# Patient Record
Sex: Female | Born: 1965 | Race: White | Hispanic: No | Smoking: Current every day smoker
Health system: Southern US, Community
[De-identification: ages and names within clinical notes are randomized; demographics above are authoritative.]

## PROBLEM LIST (undated history)

## (undated) DIAGNOSIS — F329 Major depressive disorder, single episode, unspecified: Secondary | ICD-10-CM

## (undated) DIAGNOSIS — D751 Secondary polycythemia: Secondary | ICD-10-CM

## (undated) DIAGNOSIS — F32A Depression, unspecified: Secondary | ICD-10-CM

## (undated) DIAGNOSIS — F419 Anxiety disorder, unspecified: Secondary | ICD-10-CM

## (undated) DIAGNOSIS — M1990 Unspecified osteoarthritis, unspecified site: Secondary | ICD-10-CM

## (undated) DIAGNOSIS — M5030 Other cervical disc degeneration, unspecified cervical region: Secondary | ICD-10-CM

## (undated) DIAGNOSIS — T7840XA Allergy, unspecified, initial encounter: Secondary | ICD-10-CM

## (undated) HISTORY — DX: Secondary polycythemia: D75.1

## (undated) HISTORY — PX: APPENDECTOMY: SHX54

## (undated) HISTORY — PX: ABLATION: SHX5711

## (undated) HISTORY — DX: Depression, unspecified: F32.A

## (undated) HISTORY — DX: Unspecified osteoarthritis, unspecified site: M19.90

## (undated) HISTORY — DX: Other cervical disc degeneration, unspecified cervical region: M50.30

## (undated) HISTORY — DX: Allergy, unspecified, initial encounter: T78.40XA

## (undated) HISTORY — PX: ABDOMINAL EXPLORATION SURGERY: SHX538

## (undated) HISTORY — DX: Anxiety disorder, unspecified: F41.9

## (undated) HISTORY — DX: Major depressive disorder, single episode, unspecified: F32.9

## (undated) HISTORY — PX: TUBAL LIGATION: SHX77

---

## 2003-06-06 ENCOUNTER — Emergency Department (HOSPITAL_COMMUNITY): Admission: EM | Admit: 2003-06-06 | Discharge: 2003-06-06 | Payer: Self-pay | Admitting: Emergency Medicine

## 2003-06-06 ENCOUNTER — Encounter: Payer: Self-pay | Admitting: Emergency Medicine

## 2004-02-03 ENCOUNTER — Other Ambulatory Visit: Admission: RE | Admit: 2004-02-03 | Discharge: 2004-02-03 | Payer: Self-pay | Admitting: Family Medicine

## 2005-10-25 ENCOUNTER — Other Ambulatory Visit: Admission: RE | Admit: 2005-10-25 | Discharge: 2005-10-25 | Payer: Self-pay | Admitting: Family Medicine

## 2006-12-21 ENCOUNTER — Other Ambulatory Visit: Admission: RE | Admit: 2006-12-21 | Discharge: 2006-12-21 | Payer: Self-pay | Admitting: Family Medicine

## 2008-01-03 ENCOUNTER — Other Ambulatory Visit: Admission: RE | Admit: 2008-01-03 | Discharge: 2008-01-03 | Payer: Self-pay | Admitting: Family Medicine

## 2008-04-07 ENCOUNTER — Encounter: Admission: RE | Admit: 2008-04-07 | Discharge: 2008-04-07 | Payer: Self-pay | Admitting: Family Medicine

## 2008-07-03 ENCOUNTER — Encounter: Admission: RE | Admit: 2008-07-03 | Discharge: 2008-07-22 | Payer: Self-pay | Admitting: Family Medicine

## 2008-09-01 ENCOUNTER — Encounter: Admission: RE | Admit: 2008-09-01 | Discharge: 2008-10-07 | Payer: Self-pay | Admitting: Family Medicine

## 2009-02-03 ENCOUNTER — Other Ambulatory Visit: Admission: RE | Admit: 2009-02-03 | Discharge: 2009-02-03 | Payer: Self-pay | Admitting: Family Medicine

## 2009-11-22 ENCOUNTER — Emergency Department: Payer: Self-pay | Admitting: Emergency Medicine

## 2010-02-10 ENCOUNTER — Other Ambulatory Visit: Admission: RE | Admit: 2010-02-10 | Discharge: 2010-02-10 | Payer: Self-pay | Admitting: Family Medicine

## 2010-03-04 ENCOUNTER — Encounter: Admission: RE | Admit: 2010-03-04 | Discharge: 2010-03-04 | Payer: Self-pay | Admitting: Family Medicine

## 2010-12-12 ENCOUNTER — Ambulatory Visit: Payer: BC Managed Care – PPO | Attending: Specialist

## 2010-12-12 DIAGNOSIS — M25619 Stiffness of unspecified shoulder, not elsewhere classified: Secondary | ICD-10-CM | POA: Insufficient documentation

## 2010-12-12 DIAGNOSIS — IMO0001 Reserved for inherently not codable concepts without codable children: Secondary | ICD-10-CM | POA: Insufficient documentation

## 2010-12-12 DIAGNOSIS — M25519 Pain in unspecified shoulder: Secondary | ICD-10-CM | POA: Insufficient documentation

## 2010-12-12 DIAGNOSIS — R5381 Other malaise: Secondary | ICD-10-CM | POA: Insufficient documentation

## 2010-12-14 ENCOUNTER — Ambulatory Visit: Payer: BC Managed Care – PPO | Admitting: Physical Therapy

## 2010-12-19 ENCOUNTER — Ambulatory Visit: Payer: BC Managed Care – PPO | Admitting: Physical Therapy

## 2010-12-22 ENCOUNTER — Ambulatory Visit: Payer: BC Managed Care – PPO

## 2010-12-26 ENCOUNTER — Ambulatory Visit: Payer: BC Managed Care – PPO

## 2010-12-29 ENCOUNTER — Ambulatory Visit: Payer: BC Managed Care – PPO

## 2011-01-05 ENCOUNTER — Ambulatory Visit: Payer: BC Managed Care – PPO

## 2011-01-10 ENCOUNTER — Ambulatory Visit: Payer: BC Managed Care – PPO | Attending: Specialist

## 2011-01-10 DIAGNOSIS — R5381 Other malaise: Secondary | ICD-10-CM | POA: Insufficient documentation

## 2011-01-10 DIAGNOSIS — IMO0001 Reserved for inherently not codable concepts without codable children: Secondary | ICD-10-CM | POA: Insufficient documentation

## 2011-01-10 DIAGNOSIS — M25519 Pain in unspecified shoulder: Secondary | ICD-10-CM | POA: Insufficient documentation

## 2011-01-10 DIAGNOSIS — M25619 Stiffness of unspecified shoulder, not elsewhere classified: Secondary | ICD-10-CM | POA: Insufficient documentation

## 2011-03-17 ENCOUNTER — Other Ambulatory Visit: Payer: Self-pay | Admitting: Family Medicine

## 2011-03-17 ENCOUNTER — Ambulatory Visit (HOSPITAL_COMMUNITY)
Admission: RE | Admit: 2011-03-17 | Discharge: 2011-03-17 | Disposition: A | Discharge status: Home / Self Care | Payer: BC Managed Care – PPO | Source: Ambulatory Visit | Attending: Family Medicine | Admitting: Family Medicine

## 2011-03-17 DIAGNOSIS — M79606 Pain in leg, unspecified: Secondary | ICD-10-CM

## 2011-03-17 DIAGNOSIS — R609 Edema, unspecified: Secondary | ICD-10-CM

## 2011-03-17 DIAGNOSIS — M79609 Pain in unspecified limb: Secondary | ICD-10-CM | POA: Insufficient documentation

## 2011-03-17 DIAGNOSIS — M7989 Other specified soft tissue disorders: Secondary | ICD-10-CM | POA: Insufficient documentation

## 2011-04-04 ENCOUNTER — Other Ambulatory Visit: Payer: Self-pay | Admitting: Family Medicine

## 2011-04-04 ENCOUNTER — Other Ambulatory Visit (HOSPITAL_COMMUNITY)
Admission: RE | Admit: 2011-04-04 | Discharge: 2011-04-04 | Disposition: A | Discharge status: Home / Self Care | Payer: BC Managed Care – PPO | Source: Ambulatory Visit | Attending: Family Medicine | Admitting: Family Medicine

## 2011-04-04 DIAGNOSIS — Z124 Encounter for screening for malignant neoplasm of cervix: Secondary | ICD-10-CM | POA: Insufficient documentation

## 2011-04-04 DIAGNOSIS — Z1159 Encounter for screening for other viral diseases: Secondary | ICD-10-CM | POA: Insufficient documentation

## 2011-05-16 ENCOUNTER — Other Ambulatory Visit: Payer: Self-pay | Admitting: Family Medicine

## 2011-05-16 DIAGNOSIS — N644 Mastodynia: Secondary | ICD-10-CM

## 2011-05-22 ENCOUNTER — Ambulatory Visit
Admission: RE | Admit: 2011-05-22 | Discharge: 2011-05-22 | Disposition: A | Discharge status: Home / Self Care | Payer: BC Managed Care – PPO | Source: Ambulatory Visit | Attending: Family Medicine | Admitting: Family Medicine

## 2011-05-22 DIAGNOSIS — N644 Mastodynia: Secondary | ICD-10-CM

## 2011-11-07 ENCOUNTER — Other Ambulatory Visit: Payer: Self-pay | Admitting: Otolaryngology

## 2011-11-07 DIAGNOSIS — H905 Unspecified sensorineural hearing loss: Secondary | ICD-10-CM

## 2011-11-10 ENCOUNTER — Ambulatory Visit
Admission: RE | Admit: 2011-11-10 | Discharge: 2011-11-10 | Disposition: A | Discharge status: Home / Self Care | Payer: BC Managed Care – PPO | Source: Ambulatory Visit | Attending: Otolaryngology | Admitting: Otolaryngology

## 2011-11-10 DIAGNOSIS — H905 Unspecified sensorineural hearing loss: Secondary | ICD-10-CM

## 2011-11-10 MED ORDER — GADOBENATE DIMEGLUMINE 529 MG/ML IV SOLN
15.0000 mL | Freq: Once | INTRAVENOUS | Status: AC | PRN
  Administered 2011-11-10: 15 mL via INTRAVENOUS

## 2012-05-16 ENCOUNTER — Other Ambulatory Visit: Payer: Self-pay | Admitting: Family Medicine

## 2012-05-16 DIAGNOSIS — Z1231 Encounter for screening mammogram for malignant neoplasm of breast: Secondary | ICD-10-CM

## 2012-06-04 ENCOUNTER — Ambulatory Visit
Admission: RE | Admit: 2012-06-04 | Discharge: 2012-06-04 | Disposition: A | Discharge status: Home / Self Care | Payer: BC Managed Care – PPO | Source: Ambulatory Visit | Attending: Family Medicine | Admitting: Family Medicine

## 2012-06-04 DIAGNOSIS — Z1231 Encounter for screening mammogram for malignant neoplasm of breast: Secondary | ICD-10-CM

## 2012-06-07 ENCOUNTER — Other Ambulatory Visit: Payer: Self-pay | Admitting: Family Medicine

## 2012-06-07 DIAGNOSIS — R928 Other abnormal and inconclusive findings on diagnostic imaging of breast: Secondary | ICD-10-CM

## 2012-06-12 ENCOUNTER — Ambulatory Visit
Admission: RE | Admit: 2012-06-12 | Discharge: 2012-06-12 | Disposition: A | Discharge status: Home / Self Care | Payer: BC Managed Care – PPO | Source: Ambulatory Visit | Attending: Family Medicine | Admitting: Family Medicine

## 2012-06-12 DIAGNOSIS — R928 Other abnormal and inconclusive findings on diagnostic imaging of breast: Secondary | ICD-10-CM

## 2013-05-19 ENCOUNTER — Ambulatory Visit
Admission: RE | Admit: 2013-05-19 | Discharge: 2013-05-19 | Disposition: A | Discharge status: Home / Self Care | Payer: BC Managed Care – PPO | Source: Ambulatory Visit | Attending: Family Medicine | Admitting: Family Medicine

## 2013-05-19 ENCOUNTER — Other Ambulatory Visit: Payer: Self-pay | Admitting: Lactation Services

## 2013-05-19 DIAGNOSIS — R609 Edema, unspecified: Secondary | ICD-10-CM

## 2013-05-20 ENCOUNTER — Other Ambulatory Visit: Payer: Self-pay | Admitting: Family Medicine

## 2013-05-20 DIAGNOSIS — N921 Excessive and frequent menstruation with irregular cycle: Secondary | ICD-10-CM

## 2013-05-21 ENCOUNTER — Other Ambulatory Visit: Payer: Self-pay

## 2013-05-21 DIAGNOSIS — Z1231 Encounter for screening mammogram for malignant neoplasm of breast: Secondary | ICD-10-CM

## 2013-05-22 ENCOUNTER — Ambulatory Visit
Admission: RE | Admit: 2013-05-22 | Discharge: 2013-05-22 | Disposition: A | Discharge status: Home / Self Care | Payer: BC Managed Care – PPO | Source: Ambulatory Visit | Attending: Family Medicine | Admitting: Family Medicine

## 2013-05-22 DIAGNOSIS — N921 Excessive and frequent menstruation with irregular cycle: Secondary | ICD-10-CM

## 2013-06-05 ENCOUNTER — Ambulatory Visit
Admission: RE | Admit: 2013-06-05 | Discharge: 2013-06-05 | Disposition: A | Discharge status: Home / Self Care | Payer: BC Managed Care – PPO | Source: Ambulatory Visit

## 2013-06-05 DIAGNOSIS — Z1231 Encounter for screening mammogram for malignant neoplasm of breast: Secondary | ICD-10-CM

## 2013-12-17 ENCOUNTER — Other Ambulatory Visit: Payer: Self-pay | Admitting: Obstetrics & Gynecology

## 2013-12-17 ENCOUNTER — Other Ambulatory Visit (HOSPITAL_COMMUNITY)
Admission: RE | Admit: 2013-12-17 | Discharge: 2013-12-17 | Disposition: A | Discharge status: Home / Self Care | Payer: BC Managed Care – PPO | Source: Ambulatory Visit | Attending: Obstetrics & Gynecology | Admitting: Obstetrics & Gynecology

## 2013-12-17 DIAGNOSIS — Z01419 Encounter for gynecological examination (general) (routine) without abnormal findings: Secondary | ICD-10-CM | POA: Insufficient documentation

## 2014-05-01 ENCOUNTER — Other Ambulatory Visit: Payer: Self-pay

## 2014-05-01 DIAGNOSIS — Z1231 Encounter for screening mammogram for malignant neoplasm of breast: Secondary | ICD-10-CM

## 2014-06-05 ENCOUNTER — Ambulatory Visit
Admission: RE | Admit: 2014-06-05 | Discharge: 2014-06-05 | Disposition: A | Discharge status: Home / Self Care | Payer: BC Managed Care – PPO | Source: Ambulatory Visit | Attending: Family Medicine | Admitting: Family Medicine

## 2014-06-05 ENCOUNTER — Other Ambulatory Visit: Payer: Self-pay | Admitting: Family Medicine

## 2014-06-05 DIAGNOSIS — D751 Secondary polycythemia: Secondary | ICD-10-CM

## 2014-06-08 ENCOUNTER — Ambulatory Visit: Payer: BC Managed Care – PPO

## 2014-06-19 ENCOUNTER — Ambulatory Visit: Payer: BC Managed Care – PPO

## 2014-07-09 ENCOUNTER — Telehealth: Payer: Self-pay | Admitting: Hematology and Oncology

## 2014-07-09 NOTE — Telephone Encounter (Signed)
LEFT MESSAGE FOR PATIENT AND GAVE NP APPT FOR 10/6 @ 2 W/DR. [place]. CONTACT INFORMATION WAS LEFT FOR PATIENT TO RETURN CALL TO CONFIRM MESSAGE/NP APPT.

## 2014-07-14 ENCOUNTER — Ambulatory Visit (HOSPITAL_BASED_OUTPATIENT_CLINIC_OR_DEPARTMENT_OTHER): Payer: BC Managed Care – PPO

## 2014-07-14 ENCOUNTER — Encounter (INDEPENDENT_AMBULATORY_CARE_PROVIDER_SITE_OTHER): Payer: Self-pay

## 2014-07-14 ENCOUNTER — Telehealth: Payer: Self-pay | Admitting: Hematology and Oncology

## 2014-07-14 ENCOUNTER — Ambulatory Visit: Payer: BC Managed Care – PPO

## 2014-07-14 ENCOUNTER — Ambulatory Visit (HOSPITAL_BASED_OUTPATIENT_CLINIC_OR_DEPARTMENT_OTHER): Payer: BC Managed Care – PPO | Admitting: Hematology and Oncology

## 2014-07-14 ENCOUNTER — Other Ambulatory Visit: Payer: Self-pay | Admitting: *Deleted

## 2014-07-14 ENCOUNTER — Encounter: Payer: Self-pay | Admitting: Hematology and Oncology

## 2014-07-14 VITALS — BP 116/63 | HR 73 | Temp 98.5°F | Resp 18 | Ht 65.5 in | Wt 180.4 lb

## 2014-07-14 DIAGNOSIS — D751 Secondary polycythemia: Secondary | ICD-10-CM

## 2014-07-14 DIAGNOSIS — Z720 Tobacco use: Secondary | ICD-10-CM

## 2014-07-14 DIAGNOSIS — Z716 Tobacco abuse counseling: Secondary | ICD-10-CM | POA: Insufficient documentation

## 2014-07-14 HISTORY — DX: Secondary polycythemia: D75.1

## 2014-07-14 LAB — CBC WITH DIFFERENTIAL/PLATELET
BASO%: 0.1 % (ref 0.0–2.0)
BASOS ABS: 0 10*3/uL (ref 0.0–0.1)
EOS ABS: 0.1 10*3/uL (ref 0.0–0.5)
EOS%: 0.9 % (ref 0.0–7.0)
HEMATOCRIT: 48.3 % — AB (ref 34.8–46.6)
HEMOGLOBIN: 17.1 g/dL — AB (ref 11.6–15.9)
LYMPH#: 2.5 10*3/uL (ref 0.9–3.3)
LYMPH%: 31.8 % (ref 14.0–49.7)
MCH: 32.5 pg (ref 25.1–34.0)
MCHC: 35.4 g/dL (ref 31.5–36.0)
MCV: 91.8 fL (ref 79.5–101.0)
MONO#: 0.6 10*3/uL (ref 0.1–0.9)
MONO%: 7.7 % (ref 0.0–14.0)
NEUT%: 59.5 % (ref 38.4–76.8)
NEUTROS ABS: 4.6 10*3/uL (ref 1.5–6.5)
Platelets: 175 10*3/uL (ref 145–400)
RBC: 5.26 10*6/uL (ref 3.70–5.45)
RDW: 13.3 % (ref 11.2–14.5)
WBC: 7.7 10*3/uL (ref 3.9–10.3)

## 2014-07-14 NOTE — Progress Notes (Signed)
[facility] NOTE  Patient Care Team: Carola Mitzi Davenport, MD as PCP - General (Family Medicine)  CHIEF COMPLAINTS/PURPOSE OF CONSULTATION:  Erythrocytosis  HISTORY OF PRESENTING ILLNESS:  Erica Bailey 48 y.o. female is here because of elevated hemoglobin.  She was found to have abnormal CBC from routine blood work monitoring. The CBC from February 2015 to present had fluctuated from 15.8-16.7. Serum erythropoietin was within normal limits She denies intermittent headaches, shortness of breath on exertion, or occasional chest pain. She complained of leg cramps She never suffer from diagnosis of blood clot.  There is no prior diagnosis of obstructive sleep apnea. The patient denies weight loss or skin itching. The patient is a smoker and currently smokes 1/2 pack of cigarettes per day for the last 20+ years.  MEDICAL HISTORY:  Past Medical History  Diagnosis Date  . Depression   . Arthritis   . Polycythemia   . Polycythemia secondary to smoking 07/14/2014    SURGICAL HISTORY: Past Surgical History  Procedure Laterality Date  . Abdominal exploration surgery    . Tubal ligation    . Ablation      SOCIAL HISTORY: History   Social History  . Marital Status: Unknown    Spouse Name: N/A    Number of Children: N/A  . Years of Education: N/A   Occupational History  . Not on file.   Social History Main Topics  . Smoking status: Current Every Day Smoker -- 1.00 packs/day for 28 years  . Smokeless tobacco: Never Used  . Alcohol Use: No  . Drug Use: No  . Sexual Activity: Not on file   Other Topics Concern  . Not on file   Social History Narrative  . No narrative on file    FAMILY HISTORY: Family History  Problem Relation Age of Onset  . Cancer Mother     lung ca  . Clotting disorder Father     embolism    ALLERGIES:  is allergic to chantix; flexeril; and prednisone.  MEDICATIONS:  Current Outpatient Prescriptions  Medication Sig  Dispense Refill  . ALPRAZolam (XANAX) 1 MG tablet Take 1 mg by mouth 2 (two) times daily as needed.      Marland Kitchen escitalopram (LEXAPRO) 20 MG tablet Take 20 mg by mouth daily.      Marland Kitchen ibuprofen (ADVIL,MOTRIN) 200 MG tablet Take 200 mg by mouth every 6 (six) hours as needed.      . metaxalone (SKELAXIN) 800 MG tablet Take by mouth as needed.       No current facility-administered medications for this visit.    REVIEW OF SYSTEMS:   Constitutional: Denies fevers, chills or abnormal night sweats Eyes: Denies blurriness of vision, double vision or watery eyes Ears, nose, mouth, throat, and face: Denies mucositis or sore throat Respiratory: Denies cough, dyspnea or wheezes Cardiovascular: Denies palpitation, chest discomfort or lower extremity swelling Gastrointestinal:  Denies nausea, heartburn or change in bowel habits Skin: Denies abnormal skin rashes Lymphatics: Denies new lymphadenopathy or easy bruising Neurological:Denies numbness, tingling or new weaknesses Behavioral/Psych: Mood is stable, no new changes  All other systems were reviewed with the patient and are negative.  PHYSICAL EXAMINATION: ECOG PERFORMANCE STATUS: 0 - Asymptomatic  Filed Vitals:   07/14/14 1408  BP: 116/63  Pulse: 73  Temp: 98.5 F (36.9 C)  Resp: 18   Filed Weights   07/14/14 1408  Weight: 180 lb 6.4 oz (81.829 kg)    GENERAL:alert, no distress and comfortable.  She is moderately obese SKIN: skin color, texture, turgor are normal, no rashes or significant lesions EYES: normal, conjunctiva are pink and non-injected, sclera clear OROPHARYNX:no exudate, no erythema and lips, buccal mucosa, and tongue normal  NECK: supple, thyroid normal size, non-tender, without nodularity LYMPH:  no palpable lymphadenopathy in the cervical, axillary or inguinal LUNGS: clear to auscultation and percussion with normal breathing effort HEART: regular rate & rhythm and no murmurs and no lower extremity edema ABDOMEN:abdomen  soft, non-tender and normal bowel sounds Musculoskeletal:no cyanosis of digits and no clubbing  PSYCH: alert & oriented x 3 with fluent speech NEURO: no focal motor/sensory deficits  LABORATORY DATA:  I have reviewed the data as listed Recent Results (from the past 2160 hour(s))  CBC WITH DIFFERENTIAL     Status: Abnormal   Collection Time    07/14/14  3:04 PM      Result Value Ref Range   WBC 7.7  3.9 - 10.3 10e3/uL   NEUT# 4.6  1.5 - 6.5 10e3/uL   HGB 17.1 (*) 11.6 - 15.9 g/dL   HCT 48.3 (*) 34.8 - 46.6 %   Platelets 175  145 - 400 10e3/uL   MCV 91.8  79.5 - 101.0 fL   MCH 32.5  25.1 - 34.0 pg   MCHC 35.4  31.5 - 36.0 g/dL   RBC 5.26  3.70 - 5.45 10e6/uL   RDW 13.3  11.2 - 14.5 %   lymph# 2.5  0.9 - 3.3 10e3/uL   MONO# 0.6  0.1 - 0.9 10e3/uL   Eosinophils Absolute 0.1  0.0 - 0.5 10e3/uL   Basophils Absolute 0.0  0.0 - 0.1 10e3/uL   NEUT% 59.5  38.4 - 76.8 %   LYMPH% 31.8  14.0 - 49.7 %   MONO% 7.7  0.0 - 14.0 %   EOS% 0.9  0.0 - 7.0 %   BASO% 0.1  0.0 - 2.0 %   ASSESSMENT & PLAN Polycythemia secondary to smoking Polycythemia is likely due to smoking. She is motivated to quit. I told her to start aspirin to reduce the risk of thrombosis. In addition, I also recommend phlebotomy to keep hematocrit less than 48. I will get her blood rechecked again in 2 weeks. If her hematocrit is within acceptable limits, and I plan to see her back next year to recheck her blood work. If she still has persistent polycythemia, I will refer her to a pulmonologist for workup of polycythemia for possible undiagnosed obstructive sleep apnea  Tobacco abuse counseling I spent some time counseling the patient the importance of tobacco cessation. she is currently attempting to quit on her own  I gave her patient education handout and encouraged her to sign up for smoking cessation class.

## 2014-07-14 NOTE — Patient Instructions (Signed)

## 2014-07-14 NOTE — Assessment & Plan Note (Signed)
Polycythemia is likely due to smoking. She is motivated to quit. I told her to start aspirin to reduce the risk of thrombosis. In addition, I also recommend phlebotomy to keep hematocrit less than 48. I will get her blood rechecked again in 2 weeks. If her hematocrit is within acceptable limits, and I plan to see her back next year to recheck her blood work. If she still has persistent polycythemia, I will refer her to a pulmonologist for workup of polycythemia for possible undiagnosed obstructive sleep apnea

## 2014-07-14 NOTE — Progress Notes (Signed)
Right AC accessed with 16g needle for purpose of phlebotomy. Removed 510gms. Tolerated well.  Refreshment/snacks provided.   30 minute observation period. VSS

## 2014-07-14 NOTE — Telephone Encounter (Signed)
, °

## 2014-07-14 NOTE — Progress Notes (Signed)
Checked in new pt with no financial concerns. °

## 2014-07-14 NOTE — Assessment & Plan Note (Signed)
I spent some time counseling the patient the importance of tobacco cessation. she is currently attempting to quit on her own  I gave her patient education handout and encouraged her to sign up for smoking cessation class.  

## 2014-07-15 ENCOUNTER — Telehealth: Payer: Self-pay | Admitting: Hematology and Oncology

## 2014-07-15 NOTE — Telephone Encounter (Signed)
, °

## 2014-07-22 ENCOUNTER — Ambulatory Visit: Payer: BC Managed Care – PPO

## 2014-07-28 ENCOUNTER — Other Ambulatory Visit: Payer: BC Managed Care – PPO

## 2014-07-28 ENCOUNTER — Telehealth: Payer: Self-pay | Admitting: Hematology and Oncology

## 2014-07-28 NOTE — Telephone Encounter (Signed)
pt called to r/s lab...done pt pt request....pt aware of new d.t

## 2014-07-30 ENCOUNTER — Other Ambulatory Visit (HOSPITAL_BASED_OUTPATIENT_CLINIC_OR_DEPARTMENT_OTHER): Payer: BC Managed Care – PPO

## 2014-07-30 DIAGNOSIS — D751 Secondary polycythemia: Secondary | ICD-10-CM

## 2014-07-30 LAB — CBC WITH DIFFERENTIAL/PLATELET
BASO%: 0.3 % (ref 0.0–2.0)
BASOS ABS: 0 10*3/uL (ref 0.0–0.1)
EOS%: 1.1 % (ref 0.0–7.0)
Eosinophils Absolute: 0.1 10*3/uL (ref 0.0–0.5)
HEMATOCRIT: 43.5 % (ref 34.8–46.6)
HEMOGLOBIN: 14.7 g/dL (ref 11.6–15.9)
LYMPH%: 32.7 % (ref 14.0–49.7)
MCH: 31.3 pg (ref 25.1–34.0)
MCHC: 33.8 g/dL (ref 31.5–36.0)
MCV: 92.8 fL (ref 79.5–101.0)
MONO#: 0.6 10*3/uL (ref 0.1–0.9)
MONO%: 10 % (ref 0.0–14.0)
NEUT#: 3.4 10*3/uL (ref 1.5–6.5)
NEUT%: 55.9 % (ref 38.4–76.8)
PLATELETS: 249 10*3/uL (ref 145–400)
RBC: 4.69 10*6/uL (ref 3.70–5.45)
RDW: 13.5 % (ref 11.2–14.5)
WBC: 6.1 10*3/uL (ref 3.9–10.3)
lymph#: 2 10*3/uL (ref 0.9–3.3)

## 2014-08-05 ENCOUNTER — Ambulatory Visit: Payer: BC Managed Care – PPO

## 2014-08-11 ENCOUNTER — Other Ambulatory Visit (HOSPITAL_BASED_OUTPATIENT_CLINIC_OR_DEPARTMENT_OTHER): Payer: BC Managed Care – PPO

## 2014-08-11 ENCOUNTER — Other Ambulatory Visit: Payer: Self-pay | Admitting: Hematology and Oncology

## 2014-08-11 ENCOUNTER — Telehealth: Payer: Self-pay | Admitting: *Deleted

## 2014-08-11 ENCOUNTER — Ambulatory Visit (HOSPITAL_BASED_OUTPATIENT_CLINIC_OR_DEPARTMENT_OTHER): Payer: BC Managed Care – PPO

## 2014-08-11 DIAGNOSIS — D751 Secondary polycythemia: Secondary | ICD-10-CM

## 2014-08-11 LAB — CBC WITH DIFFERENTIAL/PLATELET
BASO%: 0.8 % (ref 0.0–2.0)
Basophils Absolute: 0.1 10*3/uL (ref 0.0–0.1)
EOS%: 0.7 % (ref 0.0–7.0)
Eosinophils Absolute: 0.1 10*3/uL (ref 0.0–0.5)
HCT: 49.2 % — ABNORMAL HIGH (ref 34.8–46.6)
HGB: 16.1 g/dL — ABNORMAL HIGH (ref 11.6–15.9)
LYMPH%: 20.4 % (ref 14.0–49.7)
MCH: 31.2 pg (ref 25.1–34.0)
MCHC: 32.8 g/dL (ref 31.5–36.0)
MCV: 95 fL (ref 79.5–101.0)
MONO#: 0.9 10*3/uL (ref 0.1–0.9)
MONO%: 7.2 % (ref 0.0–14.0)
NEUT#: 8.4 10*3/uL — ABNORMAL HIGH (ref 1.5–6.5)
NEUT%: 70.9 % (ref 38.4–76.8)
PLATELETS: 237 10*3/uL (ref 145–400)
RBC: 5.18 10*6/uL (ref 3.70–5.45)
RDW: 13.8 % (ref 11.2–14.5)
WBC: 11.8 10*3/uL — ABNORMAL HIGH (ref 3.9–10.3)
lymph#: 2.4 10*3/uL (ref 0.9–3.3)

## 2014-08-11 NOTE — Telephone Encounter (Signed)
-----   Message from Heath Lark, MD sent at 08/11/2014 10:23 AM EST ----- Regarding: high hemoglobin 1) Is she still smoking? 2) She needs either phlebotomy here or donate blood ----- Message -----    From: [facility] Interface    Sent: 08/11/2014  10:21 AM      To: Heath Lark, MD

## 2014-08-11 NOTE — Patient Instructions (Signed)

## 2014-08-11 NOTE — Telephone Encounter (Signed)
Pt in lobby and agrees to phlebotomy today.  She is added onto infusion room scheduler per Camera operator.

## 2014-08-11 NOTE — Progress Notes (Signed)
1105-1110-Phlebotomy complete to right ac, 530 grams removed without difficulty.  Pressure dressing applied.  Pt ate snack and drink after phlebotomy and is without complaints.    1145-VS stable.  Pressure dressing remains clean, dry and intact to right ac.  Phlebotomy discharge instructions reviewed with pt.  Teach back done.

## 2014-08-25 ENCOUNTER — Telehealth: Payer: Self-pay | Admitting: *Deleted

## 2014-08-25 ENCOUNTER — Other Ambulatory Visit (HOSPITAL_BASED_OUTPATIENT_CLINIC_OR_DEPARTMENT_OTHER): Payer: BC Managed Care – PPO

## 2014-08-25 DIAGNOSIS — D751 Secondary polycythemia: Secondary | ICD-10-CM

## 2014-08-25 LAB — CBC WITH DIFFERENTIAL/PLATELET
BASO%: 0.5 % (ref 0.0–2.0)
Basophils Absolute: 0 10*3/uL (ref 0.0–0.1)
EOS ABS: 0.1 10*3/uL (ref 0.0–0.5)
EOS%: 0.8 % (ref 0.0–7.0)
HCT: 45.8 % (ref 34.8–46.6)
HGB: 14.9 g/dL (ref 11.6–15.9)
LYMPH%: 21.6 % (ref 14.0–49.7)
MCH: 31 pg (ref 25.1–34.0)
MCHC: 32.5 g/dL (ref 31.5–36.0)
MCV: 95.2 fL (ref 79.5–101.0)
MONO#: 0.5 10*3/uL (ref 0.1–0.9)
MONO%: 5.9 % (ref 0.0–14.0)
NEUT%: 71.2 % (ref 38.4–76.8)
NEUTROS ABS: 6.6 10*3/uL — AB (ref 1.5–6.5)
Platelets: 280 10*3/uL (ref 145–400)
RBC: 4.81 10*6/uL (ref 3.70–5.45)
RDW: 13.3 % (ref 11.2–14.5)
WBC: 9.3 10*3/uL (ref 3.9–10.3)
lymph#: 2 10*3/uL (ref 0.9–3.3)

## 2014-08-25 NOTE — Telephone Encounter (Signed)
-----   Message from Heath Lark, MD sent at 08/25/2014 11:57 AM EST ----- Regarding: labs normal Can you let her know. If she is OK we can also cancel next year's appt ----- Message -----    From: [facility]    Sent: 08/25/2014  10:34 AM      To: Heath Lark, MD

## 2014-08-25 NOTE — Telephone Encounter (Signed)
I left VM for pt informing of lab results good and pt does not need to come back as scheduled next year.  She can f/u w/ her PCP and we can cancel her appts here in February if she is comfortable w/ this plan.  Asked pt to call back to let us know.

## 2014-09-15 ENCOUNTER — Other Ambulatory Visit: Payer: Self-pay | Admitting: Hematology and Oncology

## 2014-10-16 ENCOUNTER — Telehealth: Payer: Self-pay | Admitting: Hematology and Oncology

## 2014-10-16 NOTE — Telephone Encounter (Signed)
lvm for pt regarding to appt moved from 2.10 to 2.16 per md on pal.....mailed pt appt sched and letter of d.t change of appt due to MD on pal

## 2014-10-29 ENCOUNTER — Telehealth: Payer: Self-pay | Admitting: *Deleted

## 2014-10-29 NOTE — Telephone Encounter (Signed)
Dr. Alvy Bimler reviewed pt's CBC from PCP.   Hct 48.2.  Dr. Alvy Bimler instructed to add on phlebotomy appt to next appt on 2/16.   Request sent to scheduler and notified pt.  She verbalized understanding.

## 2014-11-18 ENCOUNTER — Other Ambulatory Visit: Payer: BC Managed Care – PPO

## 2014-11-18 ENCOUNTER — Ambulatory Visit: Payer: BC Managed Care – PPO | Admitting: Hematology and Oncology

## 2014-11-24 ENCOUNTER — Encounter: Payer: Self-pay | Admitting: Hematology and Oncology

## 2014-11-24 ENCOUNTER — Ambulatory Visit (HOSPITAL_BASED_OUTPATIENT_CLINIC_OR_DEPARTMENT_OTHER): Payer: BLUE CROSS/BLUE SHIELD | Admitting: Hematology and Oncology

## 2014-11-24 ENCOUNTER — Other Ambulatory Visit (HOSPITAL_BASED_OUTPATIENT_CLINIC_OR_DEPARTMENT_OTHER): Payer: BLUE CROSS/BLUE SHIELD

## 2014-11-24 VITALS — BP 121/48 | HR 87 | Temp 98.7°F | Resp 22 | Ht 65.5 in | Wt 184.5 lb

## 2014-11-24 DIAGNOSIS — D751 Secondary polycythemia: Secondary | ICD-10-CM

## 2014-11-24 DIAGNOSIS — Z716 Tobacco abuse counseling: Secondary | ICD-10-CM

## 2014-11-24 DIAGNOSIS — Z720 Tobacco use: Secondary | ICD-10-CM

## 2014-11-24 LAB — CBC WITH DIFFERENTIAL/PLATELET
BASO%: 0.3 % (ref 0.0–2.0)
BASOS ABS: 0 10*3/uL (ref 0.0–0.1)
EOS ABS: 0.1 10*3/uL (ref 0.0–0.5)
EOS%: 1.3 % (ref 0.0–7.0)
HEMATOCRIT: 47.8 % — AB (ref 34.8–46.6)
HEMOGLOBIN: 16.2 g/dL — AB (ref 11.6–15.9)
LYMPH#: 1.9 10*3/uL (ref 0.9–3.3)
LYMPH%: 29.6 % (ref 14.0–49.7)
MCH: 30.1 pg (ref 25.1–34.0)
MCHC: 33.9 g/dL (ref 31.5–36.0)
MCV: 88.7 fL (ref 79.5–101.0)
MONO#: 0.5 10*3/uL (ref 0.1–0.9)
MONO%: 8.3 % (ref 0.0–14.0)
NEUT#: 3.9 10*3/uL (ref 1.5–6.5)
NEUT%: 60.5 % (ref 38.4–76.8)
PLATELETS: 199 10*3/uL (ref 145–400)
RBC: 5.39 10*6/uL (ref 3.70–5.45)
RDW: 15 % — ABNORMAL HIGH (ref 11.2–14.5)
WBC: 6.4 10*3/uL (ref 3.9–10.3)

## 2014-11-24 NOTE — Assessment & Plan Note (Signed)
Polycythemia is likely due to smoking. She is motivated to quit. I told her to start aspirin to reduce the risk of thrombosis. She helped aspirin recently as she is taking ibuprofen for back pain. In addition, I also recommend phlebotomy to keep hematocrit less than 48. The patient is willing to go to blood bank to donate blood instead of coming back here for phlebotomy. I suspect her polycythemia will resolve once she quit smoking. She will continue close follow-up with PCP with periodic blood work monitoring.

## 2014-11-24 NOTE — Progress Notes (Signed)
[facility] OFFICE PROGRESS NOTE  Erica Shouts, MD SUMMARY OF HEMATOLOGIC HISTORY:  She was found to have abnormal CBC from routine blood work monitoring. The CBC from February 2015 to present had fluctuated from 15.8-16.7. Serum erythropoietin was within normal limits She denies intermittent headaches, shortness of breath on exertion, or occasional chest pain. She complained of leg cramps She never suffer from diagnosis of blood clot.  There is no prior diagnosis of obstructive sleep apnea. The patient denies weight loss or skin itching. The patient is a smoker and currently smokes 1/2 pack of cigarettes per day for the last 20+ years. She had phlebotomy for polycythemia. INTERVAL HISTORY: Erica Bailey 49 y.o. female returns for further follow-up. She had quit smoking recently but went back to smoking again. She is taking ibuprofen now for recent muscular injury. She stopped taking aspirin. She complained of leg cramps. Denies recent diagnosis of blood clot.  I have reviewed the past medical history, past surgical history, social history and family history with the patient and they are unchanged from previous note.  ALLERGIES:  is allergic to chantix; flexeril; and prednisone.  MEDICATIONS:  Current Outpatient Prescriptions  Medication Sig Dispense Refill  . ALPRAZolam (XANAX) 1 MG tablet Take 1 mg by mouth 2 (two) times daily as needed.    Marland Kitchen escitalopram (LEXAPRO) 20 MG tablet Take 20 mg by mouth daily.    Marland Kitchen ibuprofen (ADVIL,MOTRIN) 200 MG tablet Take 200 mg by mouth every 6 (six) hours as needed.    . metaxalone (SKELAXIN) 800 MG tablet Take by mouth as needed.     No current facility-administered medications for this visit.     REVIEW OF SYSTEMS:   Constitutional: Denies fevers, chills or night sweats Eyes: Denies blurriness of vision Ears, nose, mouth, throat, and face: Denies mucositis or sore throat Respiratory: Denies cough, dyspnea or  wheezes Cardiovascular: Denies palpitation, chest discomfort or lower extremity swelling Gastrointestinal:  Denies nausea, heartburn or change in bowel habits Skin: Denies abnormal skin rashes Lymphatics: Denies new lymphadenopathy or easy bruising Neurological:Denies numbness, tingling or new weaknesses Behavioral/Psych: Mood is stable, no new changes  All other systems were reviewed with the patient and are negative.  PHYSICAL EXAMINATION: ECOG PERFORMANCE STATUS: 0 - Asymptomatic  Filed Vitals:   11/24/14 1148  BP: 121/48  Pulse: 87  Temp: 98.7 F (37.1 C)  Resp: 22   Filed Weights   11/24/14 1148  Weight: 184 lb 8 oz (83.689 kg)    GENERAL:alert, no distress and comfortable SKIN: skin color, texture, turgor are normal, no rashes or significant lesions EYES: normal, Conjunctiva are pink and non-injected, sclera clear Musculoskeletal:no cyanosis of digits and no clubbing  NEURO: alert & oriented x 3 with fluent speech, no focal motor/sensory deficits  LABORATORY DATA:  I have reviewed the data as listed Results for orders placed or performed in visit on 11/24/14 (from the past 48 hour(s))  CBC with Differential     Status: Abnormal   Collection Time: 11/24/14 11:31 AM  Result Value Ref Range   WBC 6.4 3.9 - 10.3 10e3/uL   NEUT# 3.9 1.5 - 6.5 10e3/uL   HGB 16.2 (H) 11.6 - 15.9 g/dL   HCT 47.8 (H) 34.8 - 46.6 %   Platelets 199 145 - 400 10e3/uL   MCV 88.7 79.5 - 101.0 fL   MCH 30.1 25.1 - 34.0 pg   MCHC 33.9 31.5 - 36.0 g/dL   RBC 5.39 3.70 - 5.45 10e6/uL  RDW 15.0 (H) 11.2 - 14.5 %   lymph# 1.9 0.9 - 3.3 10e3/uL   MONO# 0.5 0.1 - 0.9 10e3/uL   Eosinophils Absolute 0.1 0.0 - 0.5 10e3/uL   Basophils Absolute 0.0 0.0 - 0.1 10e3/uL   NEUT% 60.5 38.4 - 76.8 %   LYMPH% 29.6 14.0 - 49.7 %   MONO% 8.3 0.0 - 14.0 %   EOS% 1.3 0.0 - 7.0 %   BASO% 0.3 0.0 - 2.0 %    Lab Results  Component Value Date   WBC 6.4 11/24/2014   HGB 16.2* 11/24/2014   HCT 47.8* 11/24/2014    MCV 88.7 11/24/2014   PLT 199 11/24/2014    ASSESSMENT & PLAN:  Polycythemia secondary to smoking Polycythemia is likely due to smoking. She is motivated to quit. I told her to start aspirin to reduce the risk of thrombosis. She helped aspirin recently as she is taking ibuprofen for back pain. In addition, I also recommend phlebotomy to keep hematocrit less than 48. The patient is willing to go to blood bank to donate blood instead of coming back here for phlebotomy. I suspect her polycythemia will resolve once she quit smoking. She will continue close follow-up with PCP with periodic blood work monitoring.   Tobacco abuse counseling I spent some time counseling the patient the importance of tobacco cessation. she is currently attempting to quit on her own  I gave her patient education handout and encouraged her to sign up for smoking cessation class.     All questions were answered. The patient knows to call the clinic with any problems, questions or concerns. No barriers to learning was detected.  I spent 15 minutes counseling the patient face to face. The total time spent in the appointment was 20 minutes and more than 50% was on counseling.     [facility], Zeffie Bickert, MD 2/16/20164:04 PM

## 2014-11-24 NOTE — Assessment & Plan Note (Signed)
I spent some time counseling the patient the importance of tobacco cessation. she is currently attempting to quit on her own  I gave her patient education handout and encouraged her to sign up for smoking cessation class.  

## 2015-09-15 ENCOUNTER — Other Ambulatory Visit: Payer: Self-pay | Admitting: Family Medicine

## 2015-09-15 DIAGNOSIS — R319 Hematuria, unspecified: Secondary | ICD-10-CM

## 2015-09-24 ENCOUNTER — Other Ambulatory Visit: Payer: BLUE CROSS/BLUE SHIELD

## 2015-10-01 ENCOUNTER — Inpatient Hospital Stay
Admission: RE | Admit: 2015-10-01 | Discharge: 2015-10-01 | Disposition: A | Discharge status: Home / Self Care | Payer: BLUE CROSS/BLUE SHIELD | Source: Ambulatory Visit | Attending: Family Medicine | Admitting: Family Medicine

## 2015-10-13 ENCOUNTER — Inpatient Hospital Stay: Admission: RE | Admit: 2015-10-13 | Payer: BLUE CROSS/BLUE SHIELD | Source: Ambulatory Visit

## 2015-10-15 LAB — HM HIV SCREENING LAB: HM HIV SCREENING: NEGATIVE

## 2015-10-22 ENCOUNTER — Other Ambulatory Visit: Payer: Self-pay | Admitting: Obstetrics & Gynecology

## 2015-10-22 ENCOUNTER — Other Ambulatory Visit (HOSPITAL_COMMUNITY)
Admission: RE | Admit: 2015-10-22 | Discharge: 2015-10-22 | Disposition: A | Discharge status: Home / Self Care | Payer: BLUE CROSS/BLUE SHIELD | Source: Ambulatory Visit | Attending: Obstetrics & Gynecology | Admitting: Obstetrics & Gynecology

## 2015-10-22 DIAGNOSIS — Z01411 Encounter for gynecological examination (general) (routine) with abnormal findings: Secondary | ICD-10-CM | POA: Diagnosis present

## 2015-10-22 DIAGNOSIS — Z1151 Encounter for screening for human papillomavirus (HPV): Secondary | ICD-10-CM | POA: Diagnosis not present

## 2015-10-26 LAB — CYTOLOGY - PAP

## 2016-07-31 ENCOUNTER — Other Ambulatory Visit: Payer: Self-pay | Admitting: Family Medicine

## 2016-07-31 DIAGNOSIS — Z1231 Encounter for screening mammogram for malignant neoplasm of breast: Secondary | ICD-10-CM

## 2016-08-08 ENCOUNTER — Ambulatory Visit: Payer: BLUE CROSS/BLUE SHIELD

## 2016-10-23 ENCOUNTER — Ambulatory Visit
Admission: RE | Admit: 2016-10-23 | Discharge: 2016-10-23 | Disposition: A | Discharge status: Home / Self Care | Payer: BLUE CROSS/BLUE SHIELD | Source: Ambulatory Visit | Attending: Family Medicine | Admitting: Family Medicine

## 2016-10-23 ENCOUNTER — Other Ambulatory Visit: Payer: Self-pay | Admitting: Family Medicine

## 2016-10-23 DIAGNOSIS — R059 Cough, unspecified: Secondary | ICD-10-CM

## 2016-10-23 DIAGNOSIS — R05 Cough: Secondary | ICD-10-CM

## 2016-11-07 ENCOUNTER — Ambulatory Visit: Payer: BLUE CROSS/BLUE SHIELD

## 2017-08-28 ENCOUNTER — Ambulatory Visit
Admission: RE | Admit: 2017-08-28 | Discharge: 2017-08-28 | Disposition: A | Discharge status: Home / Self Care | Payer: BLUE CROSS/BLUE SHIELD | Source: Ambulatory Visit | Attending: Family Medicine | Admitting: Family Medicine

## 2017-08-28 DIAGNOSIS — Z1231 Encounter for screening mammogram for malignant neoplasm of breast: Secondary | ICD-10-CM | POA: Diagnosis present

## 2017-11-02 ENCOUNTER — Other Ambulatory Visit: Payer: Self-pay | Admitting: Family Medicine

## 2017-11-02 DIAGNOSIS — M5030 Other cervical disc degeneration, unspecified cervical region: Secondary | ICD-10-CM

## 2017-11-02 DIAGNOSIS — R102 Pelvic and perineal pain: Secondary | ICD-10-CM

## 2017-11-09 ENCOUNTER — Other Ambulatory Visit: Payer: BLUE CROSS/BLUE SHIELD

## 2017-11-12 ENCOUNTER — Inpatient Hospital Stay: Payer: BLUE CROSS/BLUE SHIELD

## 2017-11-12 ENCOUNTER — Other Ambulatory Visit: Payer: Self-pay

## 2017-11-12 ENCOUNTER — Encounter: Payer: Self-pay | Admitting: Oncology

## 2017-11-12 ENCOUNTER — Inpatient Hospital Stay: Payer: BLUE CROSS/BLUE SHIELD | Attending: Oncology | Admitting: Oncology

## 2017-11-12 VITALS — BP 110/72 | HR 85 | Temp 97.5°F | Wt 185.4 lb

## 2017-11-12 DIAGNOSIS — F17200 Nicotine dependence, unspecified, uncomplicated: Secondary | ICD-10-CM | POA: Diagnosis not present

## 2017-11-12 DIAGNOSIS — R5383 Other fatigue: Secondary | ICD-10-CM

## 2017-11-12 DIAGNOSIS — D751 Secondary polycythemia: Secondary | ICD-10-CM | POA: Diagnosis not present

## 2017-11-12 LAB — CBC WITH DIFFERENTIAL/PLATELET
BASOS ABS: 0.1 10*3/uL (ref 0–0.1)
Basophils Relative: 1 %
EOS PCT: 1 %
Eosinophils Absolute: 0.1 10*3/uL (ref 0–0.7)
HEMATOCRIT: 46.6 % (ref 35.0–47.0)
Hemoglobin: 15.8 g/dL (ref 12.0–16.0)
LYMPHS ABS: 1.9 10*3/uL (ref 1.0–3.6)
LYMPHS PCT: 36 %
MCH: 31.4 pg (ref 26.0–34.0)
MCHC: 33.9 g/dL (ref 32.0–36.0)
MCV: 92.8 fL (ref 80.0–100.0)
Monocytes Absolute: 0.4 10*3/uL (ref 0.2–0.9)
Monocytes Relative: 7 %
NEUTROS ABS: 3 10*3/uL (ref 1.4–6.5)
Neutrophils Relative %: 55 %
PLATELETS: 222 10*3/uL (ref 150–440)
RBC: 5.03 MIL/uL (ref 3.80–5.20)
RDW: 13.1 % (ref 11.5–14.5)
WBC: 5.5 10*3/uL (ref 3.6–11.0)

## 2017-11-12 LAB — TSH: TSH: 4.018 u[IU]/mL (ref 0.350–4.500)

## 2017-11-12 NOTE — Progress Notes (Signed)
Patient here today as a new patient  

## 2017-11-12 NOTE — Progress Notes (Addendum)
Hematology/Oncology Consult note [facility] [phone] [phone]   Patient Care Team: Angelina Pih, MD as PCP - General (Family Medicine)  REFERRING PROVIDER: Dr.Webb Primary care physician CHIEF COMPLAINTS/PURPOSE OF CONSULTATION:  Evaluation of polycythemia.  HISTORY OF PRESENTING ILLNESS:  Erica Bailey is a  52 y.o.  female with PMH listed below who was referred to me for evaluation of polycythemia. Patient follows with primary care physician and had labs done recently.  Reviewing patient outside facility lab test, October 31, 2017 CBC showed WBC 6.3, hemoglobin 16.5, hematocrit 50, platelet count 235, creatinine 1.86, normal LFT. Ferritin 39.  Patient also reports fatigue.  She attributes to going through divorce process currently.   Denies any weight loss, fever or chills.  Denies any pain. Review of Systems  Constitutional: Negative for chills, fever and malaise/fatigue.  HENT: Negative for nosebleeds.   Eyes: Negative for blurred vision.  Respiratory: Negative for cough.   Cardiovascular: Negative for chest pain.  Gastrointestinal: Negative for nausea and vomiting.  Genitourinary: Negative for dysuria.  Musculoskeletal: Negative for myalgias.  Skin: Negative for rash.  Neurological: Negative for dizziness.  Endo/Heme/Allergies: Does not bruise/bleed easily.  Psychiatric/Behavioral: Negative for depression.    MEDICAL HISTORY:  Past Medical History:  Diagnosis Date  . Arthritis   . Depression   . Polycythemia   . Polycythemia secondary to smoking 07/14/2014    SURGICAL HISTORY: Past Surgical History:  Procedure Laterality Date  . ABDOMINAL EXPLORATION SURGERY    . ABLATION    . TUBAL LIGATION      SOCIAL HISTORY: Social History   Socioeconomic History  . Marital status: Unknown    Spouse name: Not on file  . Number of children: Not on file  . Years of education: Not on file  . Highest education level:  Not on file  Social Needs  . Financial resource strain: Not on file  . Food insecurity - worry: Not on file  . Food insecurity - inability: Not on file  . Transportation needs - medical: Not on file  . Transportation needs - non-medical: Not on file  Occupational History  . Not on file  Tobacco Use  . Smoking status: Current Every Day Smoker    Packs/day: 1.00    Years: 28.00    Pack years: 28.00  . Smokeless tobacco: Never Used  Substance and Sexual Activity  . Alcohol use: No  . Drug use: No  . Sexual activity: Not on file  Other Topics Concern  . Not on file  Social History Narrative  . Not on file    FAMILY HISTORY: Family History  Problem Relation Age of Onset  . Cancer Mother        lung ca  . Clotting disorder Father        embolism  . Breast cancer Sister        Breast Cancer    ALLERGIES:  is allergic to chantix [varenicline]; flexeril [cyclobenzaprine]; and prednisone.  MEDICATIONS:  Current Outpatient Medications  Medication Sig Dispense Refill  . ALPRAZolam (XANAX) 1 MG tablet Take 1 mg by mouth 2 (two) times daily as needed.    Marland Kitchen buPROPion (WELLBUTRIN) 100 MG tablet Take 100 mg by mouth 2 (two) times daily.  1  . cetirizine (ZYRTEC) 10 MG tablet Take 10 mg by mouth daily.    Marland Kitchen DYMISTA 137-50 MCG/ACT SUSP   1  . ibuprofen (ADVIL,MOTRIN) 200 MG tablet Take 200 mg by mouth every 6 (six) hours  as needed.    . metaxalone (SKELAXIN) 800 MG tablet Take by mouth as needed.    Marland Kitchen PROAIR HFA 108 (90 Base) MCG/ACT inhaler   0   No current facility-administered medications for this visit.      PHYSICAL EXAMINATION: ECOG PERFORMANCE STATUS: 0 - Asymptomatic Vitals:   11/12/17 0959  BP: 110/72  Pulse: 85  Temp: (!) 97.5 F (36.4 C)   Filed Weights   11/12/17 0959  Weight: 185 lb 7 oz (84.1 kg)    Physical Exam  Constitutional: She is oriented to person, place, and time and well-developed, well-nourished, and in no distress. No distress.  HENT:    Head: Normocephalic and atraumatic.  Mouth/Throat: No oropharyngeal exudate.  Eyes: Conjunctivae and EOM are normal. No scleral icterus.  Neck: Normal range of motion. Neck supple. No tracheal deviation present.  Cardiovascular: Normal rate and regular rhythm.  No murmur heard. Pulmonary/Chest: She has no wheezes.  Abdominal: Soft. Bowel sounds are normal. She exhibits no distension and no mass.  Musculoskeletal: Normal range of motion. She exhibits no edema.  Lymphadenopathy:    She has no cervical adenopathy.  Neurological: She is alert and oriented to person, place, and time. No cranial nerve deficit.  Skin: Skin is warm and dry.  Psychiatric: Affect normal.     LABORATORY DATA:  I have reviewed the data as listed Lab Results  Component Value Date   WBC 6.4 11/24/2014   HGB 16.2 (H) 11/24/2014   HCT 47.8 (H) 11/24/2014   MCV 88.7 11/24/2014   PLT 199 11/24/2014   No results for input(s): NA, K, CL, CO2, GLUCOSE, BUN, CREATININE, CALCIUM, GFRNONAA, GFRAA, PROT, ALBUMIN, AST, ALT, ALKPHOS, BILITOT, BILIDIR, IBILI in the last 8760 hours.     ASSESSMENT & PLAN:  1. Erythrocytosis    # Discussed with patient that the elevated hemoglobin/erythrocytosis is most likely secondary to smoking. We will check, erythropoietin,  monoxide level, Jak 2 mutation with reflex. # Smoking cessation was discussed with patient and cessation program information was given to patient.  All questions were answered. The patient knows to call the clinic with any problems questions or concerns.  Return of visit: 2 weeks Thank you for this kind referral and the opportunity to participate in the care of this patient. A copy of today's note is routed to referring provider    Earlie Server, MD, PhD Hematology Oncology [facility] Pager- [phone] 11/12/2017

## 2017-11-13 ENCOUNTER — Ambulatory Visit
Admission: RE | Admit: 2017-11-13 | Discharge: 2017-11-13 | Disposition: A | Discharge status: Home / Self Care | Payer: BLUE CROSS/BLUE SHIELD | Source: Ambulatory Visit | Attending: Family Medicine | Admitting: Family Medicine

## 2017-11-13 DIAGNOSIS — R102 Pelvic and perineal pain: Secondary | ICD-10-CM

## 2017-11-13 LAB — ERYTHROPOIETIN: ERYTHROPOIETIN: 12.1 m[IU]/mL (ref 2.6–18.5)

## 2017-11-13 LAB — CARBON MONOXIDE, BLOOD (PERFORMED AT REF LAB): Carbon Monoxide, Blood: 6 % — ABNORMAL HIGH (ref 0.0–3.6)

## 2017-11-25 NOTE — Progress Notes (Signed)
Hematology/Oncology Follow up note [facility] Telephone:([phone] [phone]   Patient Care Team: Maurice Small, MD as PCP - General (Family Medicine)  REFERRING PROVIDER: Dr.Webb Primary care physician REASON FOR VISIT Follow up for treatment of erythrocytosis HISTORY OF PRESENTING ILLNESS:  Erica Bailey is a  52 y.o.  female with PMH listed below who was referred to me for evaluation of polycythemia. Patient follows with primary care physician and had labs done recently.  Reviewing patient outside facility lab test, October 31, 2017 CBC showed WBC 6.3, hemoglobin 16.5, hematocrit 50, platelet count 235, creatinine 1.86, normal LFT. Ferritin 39.  Patient also reports fatigue.  She attributes to going through divorce process currently.    INTERVAL HISTORY Erica Bailey is a 52 y.o. female who has above history reviewed by me today presents for follow up visit for management of erythrocytosis. Denies any weight loss, fever or chills.  Denies any pain.  She has decreased her daily cigarette consumption.  Review of Systems  Constitutional: Negative for chills, fever and weight loss.  HENT: Negative for congestion and nosebleeds.   Eyes: Negative for blurred vision and photophobia.  Respiratory: Negative for cough and hemoptysis.   Cardiovascular: Negative for chest pain and claudication.  Gastrointestinal: Negative for nausea and vomiting.  Genitourinary: Negative for dysuria and urgency.  Musculoskeletal: Negative for myalgias and neck pain.  Skin: Negative for rash.  Neurological: Negative for dizziness and headaches.  Endo/Heme/Allergies: Does not bruise/bleed easily.  Psychiatric/Behavioral: Negative for depression.    MEDICAL HISTORY:  Past Medical History:  Diagnosis Date  . Arthritis   . Depression   . Polycythemia   . Polycythemia secondary to smoking 07/14/2014    SURGICAL HISTORY: Past Surgical History:  Procedure Laterality Date   . ABDOMINAL EXPLORATION SURGERY    . ABLATION    . APPENDECTOMY    . TUBAL LIGATION      SOCIAL HISTORY: Social History   Socioeconomic History  . Marital status: Unknown    Spouse name: Not on file  . Number of children: Not on file  . Years of education: Not on file  . Highest education level: Not on file  Social Needs  . Financial resource strain: Not on file  . Food insecurity - worry: Not on file  . Food insecurity - inability: Not on file  . Transportation needs - medical: Not on file  . Transportation needs - non-medical: Not on file  Occupational History  . Not on file  Tobacco Use  . Smoking status: Current Every Day Smoker    Packs/day: 1.00    Years: 28.00    Pack years: 28.00  . Smokeless tobacco: Never Used  . Tobacco comment: uses patches and wellbutrion   Substance and Sexual Activity  . Alcohol use: No  . Drug use: No  . Sexual activity: Not on file  Other Topics Concern  . Not on file  Social History Narrative  . Not on file    FAMILY HISTORY: Family History  Problem Relation Age of Onset  . Cancer Mother        lung ca  . Clotting disorder Father        embolism  . Breast cancer Sister        Breast Cancer    ALLERGIES:  is allergic to chantix [varenicline]; flexeril [cyclobenzaprine]; and prednisone.  MEDICATIONS:  Current Outpatient Medications  Medication Sig Dispense Refill  . buPROPion (WELLBUTRIN) 100 MG tablet Take 100 mg by  mouth 2 (two) times daily.  1  . cetirizine (ZYRTEC) 10 MG tablet Take 10 mg by mouth daily.    Marland Kitchen ibuprofen (ADVIL,MOTRIN) 200 MG tablet Take 200 mg by mouth every 6 (six) hours as needed.    . ALPRAZolam (XANAX) 1 MG tablet Take 1 mg by mouth 2 (two) times daily as needed.    Marland Kitchen DYMISTA 137-50 MCG/ACT SUSP   1  . metaxalone (SKELAXIN) 800 MG tablet Take by mouth as needed.    Marland Kitchen PROAIR HFA 108 (90 Base) MCG/ACT inhaler   0   No current facility-administered medications for this visit.      PHYSICAL  EXAMINATION: ECOG PERFORMANCE STATUS: 0 - Asymptomatic Vitals:   11/26/17 1018  BP: 102/66  Pulse: 72  Resp: 18  Temp: (!) 97.3 F (36.3 C)   Filed Weights   11/26/17 1018  Weight: 188 lb 14.4 oz (85.7 kg)    Physical Exam  Constitutional: She is oriented to person, place, and time and well-developed, well-nourished, and in no distress. No distress.  HENT:  Head: Normocephalic and atraumatic.  Mouth/Throat: No oropharyngeal exudate.  Eyes: Conjunctivae and EOM are normal. No scleral icterus.  Neck: Normal range of motion. Neck supple.  Cardiovascular: Normal rate and regular rhythm.  No murmur heard. Pulmonary/Chest: She has no wheezes.  Abdominal: Soft. Bowel sounds are normal. She exhibits no distension and no mass.  Musculoskeletal: Normal range of motion. She exhibits no edema.  Lymphadenopathy:    She has no cervical adenopathy.  Neurological: She is alert and oriented to person, place, and time. No cranial nerve deficit.  Skin: Skin is warm and dry. No erythema.  Psychiatric: Affect normal.     LABORATORY DATA:  I have reviewed the data as listed Lab Results  Component Value Date   WBC 5.5 11/12/2017   HGB 15.8 11/12/2017   HCT 46.6 11/12/2017   MCV 92.8 11/12/2017   PLT 222 11/12/2017   No results for input(s): NA, K, CL, CO2, GLUCOSE, BUN, CREATININE, CALCIUM, GFRNONAA, GFRAA, PROT, ALBUMIN, AST, ALT, ALKPHOS, BILITOT, BILIDIR, IBILI in the last 8760 hours.     ASSESSMENT & PLAN:  1. Polycythemia, secondary   2. Polycythemia secondary to smoking    # Discussed with patient that the elevated hemoglobin/erythrocytosis is most likely secondary to smoking. CBC was repeated on November 12, 2017, and her hemoglobin and platelet counts were normal.  Carbo monoxide level is elevated.  Jak 2 mutation with reflex is pending.   Discussed with patient that I think her polycythemia is most likely secondary to smoking.  Since she has decreased her cigarette smoking,  counts have been getting better.  Encouraged her to continue working on smoke cessation.   All questions were answered. The patient knows to call the clinic with any problems questions or concerns.  Return of visit: 3 months with repeat labs. Thank you for this kind referral and the opportunity to participate in the care of this patient. A copy of today's note is routed to referring provider    Earlie Server, MD, PhD Hematology Oncology [facility] at [facility] Pager- [phone] 11/26/2017

## 2017-11-26 ENCOUNTER — Inpatient Hospital Stay: Payer: BLUE CROSS/BLUE SHIELD | Admitting: Oncology

## 2017-11-26 ENCOUNTER — Encounter: Payer: Self-pay | Admitting: Oncology

## 2017-11-26 VITALS — BP 102/66 | HR 72 | Temp 97.3°F | Resp 18 | Ht 65.5 in | Wt 188.9 lb

## 2017-11-26 DIAGNOSIS — D751 Secondary polycythemia: Secondary | ICD-10-CM | POA: Diagnosis not present

## 2017-11-26 DIAGNOSIS — F17200 Nicotine dependence, unspecified, uncomplicated: Secondary | ICD-10-CM | POA: Diagnosis not present

## 2017-11-26 NOTE — Progress Notes (Signed)
No new changes noted today 

## 2017-11-27 LAB — CALR + JAK2 E12-15 + MPL (REFLEXED)

## 2017-11-27 LAB — JAK2 V617F, W REFLEX TO CALR/E12/MPL

## 2017-12-20 ENCOUNTER — Ambulatory Visit: Payer: BLUE CROSS/BLUE SHIELD | Admitting: Internal Medicine

## 2017-12-20 DIAGNOSIS — Z0289 Encounter for other administrative examinations: Secondary | ICD-10-CM

## 2018-02-25 ENCOUNTER — Other Ambulatory Visit: Payer: BLUE CROSS/BLUE SHIELD

## 2018-02-25 ENCOUNTER — Ambulatory Visit: Payer: BLUE CROSS/BLUE SHIELD | Admitting: Oncology

## 2018-02-27 ENCOUNTER — Inpatient Hospital Stay: Payer: BLUE CROSS/BLUE SHIELD | Admitting: Oncology

## 2018-02-27 ENCOUNTER — Inpatient Hospital Stay: Payer: BLUE CROSS/BLUE SHIELD

## 2018-02-28 ENCOUNTER — Inpatient Hospital Stay: Payer: BLUE CROSS/BLUE SHIELD | Admitting: Oncology

## 2018-02-28 ENCOUNTER — Inpatient Hospital Stay: Payer: BLUE CROSS/BLUE SHIELD | Attending: Oncology

## 2018-03-01 ENCOUNTER — Ambulatory Visit: Payer: BLUE CROSS/BLUE SHIELD | Admitting: Oncology

## 2018-03-01 ENCOUNTER — Other Ambulatory Visit: Payer: BLUE CROSS/BLUE SHIELD

## 2018-03-12 ENCOUNTER — Telehealth: Payer: Self-pay | Admitting: Oncology

## 2018-03-12 NOTE — Telephone Encounter (Signed)
Lab/MD 03/01/18 NO Show appts rschd per Diana/schd msg. Appts rschd for 03/22/18 @ 2 pm. Appts schd and conf with patient.

## 2018-03-22 ENCOUNTER — Other Ambulatory Visit: Payer: BLUE CROSS/BLUE SHIELD

## 2018-03-22 ENCOUNTER — Ambulatory Visit: Payer: BLUE CROSS/BLUE SHIELD | Admitting: Oncology

## 2018-03-29 ENCOUNTER — Inpatient Hospital Stay: Payer: BLUE CROSS/BLUE SHIELD | Admitting: Oncology

## 2018-03-29 ENCOUNTER — Encounter: Payer: Self-pay | Admitting: Oncology

## 2018-03-29 ENCOUNTER — Inpatient Hospital Stay: Payer: BLUE CROSS/BLUE SHIELD | Attending: Oncology

## 2018-03-29 ENCOUNTER — Other Ambulatory Visit: Payer: Self-pay

## 2018-03-29 VITALS — BP 114/73 | HR 75 | Temp 96.5°F | Resp 18 | Wt 186.2 lb

## 2018-03-29 DIAGNOSIS — D751 Secondary polycythemia: Secondary | ICD-10-CM

## 2018-03-29 DIAGNOSIS — R5383 Other fatigue: Secondary | ICD-10-CM | POA: Diagnosis not present

## 2018-03-29 DIAGNOSIS — F17210 Nicotine dependence, cigarettes, uncomplicated: Secondary | ICD-10-CM | POA: Insufficient documentation

## 2018-03-29 LAB — CBC WITH DIFFERENTIAL/PLATELET
Basophils Absolute: 0 10*3/uL (ref 0–0.1)
Basophils Relative: 0 %
Eosinophils Absolute: 0.1 10*3/uL (ref 0–0.7)
Eosinophils Relative: 2 %
HCT: 46.1 % (ref 35.0–47.0)
HEMOGLOBIN: 15.8 g/dL (ref 12.0–16.0)
LYMPHS ABS: 2.5 10*3/uL (ref 1.0–3.6)
Lymphocytes Relative: 42 %
MCH: 32.1 pg (ref 26.0–34.0)
MCHC: 34.4 g/dL (ref 32.0–36.0)
MCV: 93.5 fL (ref 80.0–100.0)
MONOS PCT: 8 %
Monocytes Absolute: 0.4 10*3/uL (ref 0.2–0.9)
NEUTROS PCT: 48 %
Neutro Abs: 2.8 10*3/uL (ref 1.4–6.5)
Platelets: 211 10*3/uL (ref 150–440)
RBC: 4.94 MIL/uL (ref 3.80–5.20)
RDW: 13.2 % (ref 11.5–14.5)
WBC: 5.8 10*3/uL (ref 3.6–11.0)

## 2018-03-29 NOTE — Progress Notes (Signed)
Patient here for follow up. States that sometimes she feels that she has "reflux" and her left side of her chest will feel pressure, episode only lasts a few minutes. Pt states it could be due to her anxiety.

## 2018-03-29 NOTE — Progress Notes (Signed)
Hematology/Oncology Follow up note [facility] Telephone:(336) [phone] [phone]) [phone]   Patient Care Team: Maurice Small, MD as PCP - General (Family Medicine)  REFERRING PROVIDER: Dr.Webb Primary care physician REASON FOR VISIT Follow up for treatment of erythrocytosis HISTORY OF PRESENTING ILLNESS:  Erica Bailey is a  52 y.o.  female with PMH listed below who was referred to me for evaluation of polycythemia. Patient follows with primary care physician and had labs done recently.  Reviewing patient outside facility lab test, October 31, 2017 CBC showed WBC 6.3, hemoglobin 16.5, hematocrit 50, platelet count 235, creatinine 1.86, normal LFT. Ferritin 39.  Patient also reports fatigue.  She attributes to going through divorce process currently.    INTERVAL HISTORY Erica Bailey is a 52 y.o. female who has above history reviewed by me today presents for follow up visit for management of erythrocytosis.  She has cut down smoking, has not quit yet.  No new compliant.   Review of Systems  Constitutional: Negative for chills, fever, malaise/fatigue and weight loss.  HENT: Negative for congestion, ear discharge, ear pain, nosebleeds, sinus pain and sore throat.   Eyes: Negative for blurred vision, double vision, photophobia, pain, discharge and redness.  Respiratory: Negative for cough, hemoptysis, sputum production, shortness of breath and wheezing.   Cardiovascular: Negative for chest pain, palpitations, orthopnea, claudication and leg swelling.  Gastrointestinal: Negative for abdominal pain, blood in stool, constipation, diarrhea, heartburn, melena, nausea and vomiting.  Genitourinary: Negative for dysuria, flank pain, frequency, hematuria and urgency.  Musculoskeletal: Negative for back pain, myalgias and neck pain.  Skin: Negative for itching and rash.  Neurological: Negative for dizziness, tingling, tremors, focal weakness, weakness and headaches.    Endo/Heme/Allergies: Negative for environmental allergies. Does not bruise/bleed easily.  Psychiatric/Behavioral: Negative for depression and hallucinations. The patient is not nervous/anxious.     MEDICAL HISTORY:  Past Medical History:  Diagnosis Date  . Arthritis   . Depression   . Polycythemia   . Polycythemia secondary to smoking 07/14/2014    SURGICAL HISTORY: Past Surgical History:  Procedure Laterality Date  . ABDOMINAL EXPLORATION SURGERY    . ABLATION    . APPENDECTOMY    . TUBAL LIGATION      SOCIAL HISTORY: Social History   Socioeconomic History  . Marital status: Unknown    Spouse name: Not on file  . Number of children: Not on file  . Years of education: Not on file  . Highest education level: Not on file  Occupational History  . Not on file  Social Needs  . Financial resource strain: Not on file  . Food insecurity:    Worry: Not on file    Inability: Not on file  . Transportation needs:    Medical: Not on file    Non-medical: Not on file  Tobacco Use  . Smoking status: Current Every Day Smoker    Packs/day: 1.00    Years: 28.00    Pack years: 28.00  . Smokeless tobacco: Never Used  . Tobacco comment: uses patches and wellbutrion   Substance and Sexual Activity  . Alcohol use: No  . Drug use: No  . Sexual activity: Not on file  Lifestyle  . Physical activity:    Days per week: Not on file    Minutes per session: Not on file  . Stress: Not on file  Relationships  . Social connections:    Talks on phone: Not on file    Gets together: Not on  file    Attends religious service: Not on file    Active member of club or organization: Not on file    Attends meetings of clubs or organizations: Not on file    Relationship status: Not on file  . Intimate partner violence:    Fear of current or ex partner: Not on file    Emotionally abused: Not on file    Physically abused: Not on file    Forced sexual activity: Not on file  Other Topics Concern   . Not on file  Social History Narrative  . Not on file    FAMILY HISTORY: Family History  Problem Relation Age of Onset  . Cancer Mother        lung ca  . Clotting disorder Father        embolism  . Breast cancer Sister        Breast Cancer    ALLERGIES:  is allergic to chantix [varenicline]; flexeril [cyclobenzaprine]; and prednisone.  MEDICATIONS:  Current Outpatient Medications  Medication Sig Dispense Refill  . ALPRAZolam (XANAX) 1 MG tablet Take 1 mg by mouth 2 (two) times daily as needed.    Marland Kitchen buPROPion (WELLBUTRIN) 100 MG tablet Take 100 mg by mouth 2 (two) times daily.  1  . cetirizine (ZYRTEC) 10 MG tablet Take 10 mg by mouth daily.    Marland Kitchen DYMISTA 137-50 MCG/ACT SUSP   1  . ibuprofen (ADVIL,MOTRIN) 200 MG tablet Take 200 mg by mouth every 6 (six) hours as needed.    . metaxalone (SKELAXIN) 800 MG tablet Take by mouth as needed.    Marland Kitchen PROAIR HFA 108 (90 Base) MCG/ACT inhaler   0   No current facility-administered medications for this visit.      PHYSICAL EXAMINATION: ECOG PERFORMANCE STATUS: 0 - Asymptomatic Vitals:   03/29/18 1512  BP: 114/73  Pulse: 75  Resp: 18  Temp: (!) 96.5 F (35.8 C)  SpO2: 96%   Filed Weights   03/29/18 1512  Weight: 186 lb 3.2 oz (84.5 kg)    Physical Exam  Constitutional: She is oriented to person, place, and time and well-developed, well-nourished, and in no distress. No distress.  HENT:  Head: Normocephalic and atraumatic.  Nose: Nose normal.  Mouth/Throat: Oropharynx is clear and moist. No oropharyngeal exudate.  Eyes: Pupils are equal, round, and reactive to light. Conjunctivae and EOM are normal. Left eye exhibits no discharge. No scleral icterus.  Neck: Normal range of motion. Neck supple. No JVD present.  Cardiovascular: Normal rate, regular rhythm and normal heart sounds.  No murmur heard. Pulmonary/Chest: Effort normal and breath sounds normal. No respiratory distress. She has no wheezes. She has no rales. She  exhibits no tenderness.  Abdominal: Soft. Bowel sounds are normal. She exhibits no distension and no mass. There is no tenderness. There is no rebound.  Musculoskeletal: Normal range of motion. She exhibits no edema or tenderness.  Lymphadenopathy:    She has no cervical adenopathy.  Neurological: She is alert and oriented to person, place, and time. No cranial nerve deficit. She exhibits normal muscle tone. Coordination normal.  Skin: Skin is warm and dry. No rash noted. She is not diaphoretic. No erythema.  Psychiatric: Affect and judgment normal.     LABORATORY DATA:  I have reviewed the data as listed Lab Results  Component Value Date   WBC 5.8 03/29/2018   HGB 15.8 03/29/2018   HCT 46.1 03/29/2018   MCV 93.5 03/29/2018   PLT 211  03/29/2018   No results for input(s): NA, K, CL, CO2, GLUCOSE, BUN, CREATININE, CALCIUM, GFRNONAA, GFRAA, PROT, ALBUMIN, AST, ALT, ALKPHOS, BILITOT, BILIDIR, IBILI in the last 8760 hours.     ASSESSMENT & PLAN:  1. Polycythemia, secondary   # labs reviewed. Hemoglobin stable. No need for phlebotomy.  Less likely primary bone marrow process. JAK2/MPL/CARL/exon 12-5 negative.  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  Encourage patient to quit smoking Follow up with pcp.  Patient can follow up with in 1 year for repeat CBC.  All questions were answered. The patient knows to call the clinic with any problems questions or concerns. Orders Placed This Encounter  Procedures  . CBC with Differential/Platelet    Standing Status:   Future    Standing Expiration Date:   09/29/2019   Return of visit: 1 year  Earlie Server, MD, PhD Hematology Oncology [facility] at [facility] Pager- [phone] 03/29/2018

## 2018-04-24 ENCOUNTER — Telehealth: Payer: Self-pay

## 2018-04-24 NOTE — Telephone Encounter (Signed)
Copied from [place] [id number]. Topic: Inquiry >> Apr 23, 2018  1:53 PM Vernona Rieger wrote: Reason for CRM:  Patient has an appt with Dr Aundra Dubin on 7/22 and has tried to reach her previous doctor's office to see if they sent her medical records and she can not get anyone to answer the phone. She wants to make sure they were received before her appt on Monday. Thanks  Call back @ [phone]

## 2018-04-29 ENCOUNTER — Ambulatory Visit: Payer: BLUE CROSS/BLUE SHIELD | Admitting: Internal Medicine

## 2018-04-29 ENCOUNTER — Encounter: Payer: Self-pay | Admitting: Internal Medicine

## 2018-04-29 VITALS — BP 102/78 | HR 74 | Temp 98.4°F | Ht 66.0 in | Wt 182.2 lb

## 2018-04-29 DIAGNOSIS — D259 Leiomyoma of uterus, unspecified: Secondary | ICD-10-CM

## 2018-04-29 DIAGNOSIS — N6001 Solitary cyst of right breast: Secondary | ICD-10-CM

## 2018-04-29 DIAGNOSIS — Z1231 Encounter for screening mammogram for malignant neoplasm of breast: Secondary | ICD-10-CM

## 2018-04-29 DIAGNOSIS — M25512 Pain in left shoulder: Secondary | ICD-10-CM

## 2018-04-29 DIAGNOSIS — Z1283 Encounter for screening for malignant neoplasm of skin: Secondary | ICD-10-CM

## 2018-04-29 DIAGNOSIS — K219 Gastro-esophageal reflux disease without esophagitis: Secondary | ICD-10-CM

## 2018-04-29 DIAGNOSIS — G8929 Other chronic pain: Secondary | ICD-10-CM

## 2018-04-29 DIAGNOSIS — J309 Allergic rhinitis, unspecified: Secondary | ICD-10-CM | POA: Diagnosis not present

## 2018-04-29 DIAGNOSIS — D751 Secondary polycythemia: Secondary | ICD-10-CM

## 2018-04-29 DIAGNOSIS — Z720 Tobacco use: Secondary | ICD-10-CM | POA: Insufficient documentation

## 2018-04-29 DIAGNOSIS — F419 Anxiety disorder, unspecified: Secondary | ICD-10-CM

## 2018-04-29 DIAGNOSIS — F329 Major depressive disorder, single episode, unspecified: Secondary | ICD-10-CM

## 2018-04-29 DIAGNOSIS — F4323 Adjustment disorder with mixed anxiety and depressed mood: Secondary | ICD-10-CM

## 2018-04-29 MED ORDER — MONTELUKAST SODIUM 10 MG PO TABS: 10.0000 mg | ORAL_TABLET | Freq: Every day | ORAL | 3 refills | Status: DC

## 2018-04-29 MED ORDER — BUPROPION HCL 100 MG PO TABS: 100.0000 mg | ORAL_TABLET | Freq: Two times a day (BID) | ORAL | 2 refills | Status: DC

## 2018-04-29 MED ORDER — ESCITALOPRAM OXALATE 10 MG PO TABS: 10.0000 mg | ORAL_TABLET | Freq: Every day | ORAL | 1 refills | Status: DC

## 2018-04-29 MED ORDER — OMEPRAZOLE 20 MG PO CPDR: 20.0000 mg | DELAYED_RELEASE_CAPSULE | Freq: Every day | ORAL | 0 refills | Status: DC

## 2018-04-29 NOTE — Progress Notes (Signed)
Pre visit review using our clinic review tool, if applicable. No additional management support is needed unless otherwise documented below in the visit note. 

## 2018-04-29 NOTE — Patient Instructions (Addendum)
Dr. Karle Barr [address]  Try Singulair at night     Gastroesophageal Reflux Disease, Adult Normally, food travels down the esophagus and stays in the stomach to be digested. If a person has gastroesophageal reflux disease (GERD), food and stomach acid move back up into the esophagus. When this happens, the esophagus becomes sore and swollen (inflamed). Over time, GERD can make small holes (ulcers) in the lining of the esophagus. Follow these instructions at home: Diet  Follow a diet as told by your doctor. You may need to avoid foods and drinks such as: ? Coffee and tea (with or without caffeine). ? Drinks that contain alcohol. ? Energy drinks and sports drinks. ? Carbonated drinks or sodas. ? Chocolate and cocoa. ? Peppermint and mint flavorings. ? Garlic and onions. ? Horseradish. ? Spicy and acidic foods, such as peppers, chili powder, curry powder, vinegar, hot sauces, and BBQ sauce. ? Citrus fruit juices and citrus fruits, such as oranges, lemons, and limes. ? Tomato-based foods, such as red sauce, chili, salsa, and pizza with red sauce. ? Fried and fatty foods, such as donuts, french fries, potato chips, and high-fat dressings. ? High-fat meats, such as hot dogs, rib eye steak, sausage, ham, and bacon. ? High-fat dairy items, such as whole milk, butter, and cream cheese.  Eat small meals often. Avoid eating large meals.  Avoid drinking large amounts of liquid with your meals.  Avoid eating meals during the 2-3 hours before bedtime.  Avoid lying down right after you eat.  Do not exercise right after you eat. General instructions  Pay attention to any changes in your symptoms.  Take over-the-counter and prescription medicines only as told by your doctor. Do not take aspirin, ibuprofen, or other NSAIDs unless your doctor says it is okay.  Do not use any tobacco products, including cigarettes, chewing tobacco, and e-cigarettes. If you need help quitting, ask your  doctor.  Wear loose clothes. Do not wear anything tight around your waist.  Raise (elevate) the head of your bed about 6 inches (15 cm).  Try to lower your stress. If you need help doing this, ask your doctor.  If you are overweight, lose an amount of weight that is healthy for you. Ask your doctor about a safe weight loss goal.  Keep all follow-up visits as told by your doctor. This is important. Contact a doctor if:  You have new symptoms.  You lose weight and you do not know why it is happening.  You have trouble swallowing, or it hurts to swallow.  You have wheezing or a cough that keeps happening.  Your symptoms do not get better with treatment.  You have a hoarse voice. Get help right away if:  You have pain in your arms, neck, jaw, teeth, or back.  You feel sweaty, dizzy, or light-headed.  You have chest pain or shortness of breath.  You throw up (vomit) and your throw up looks like blood or coffee grounds.  You pass out (faint).  Your poop (stool) is bloody or black.  You cannot swallow, drink, or eat. This information is not intended to replace advice given to you by your health care provider. Make sure you discuss any questions you have with your health care provider. Document Released: 03/13/2008 Document Revised: 03/02/2016 Document Reviewed: 01/20/2015 Elsevier Interactive Patient Education  2018 [place] for Gastroesophageal Reflux Disease, Adult When you have gastroesophageal reflux disease (GERD), the foods you eat and your  eating habits are very important. Choosing the right foods can help ease your discomfort. What guidelines do I need to follow?  Choose fruits, vegetables, whole grains, and low-fat dairy products.  Choose low-fat meat, fish, and poultry.  Limit fats such as oils, salad dressings, butter, nuts, and avocado.  Keep a food diary. This helps you identify foods that cause symptoms.  Avoid foods that cause  symptoms. These may be different for everyone.  Eat small meals often instead of 3 large meals a day.  Eat your meals slowly, in a place where you are relaxed.  Limit fried foods.  Cook foods using methods other than frying.  Avoid drinking alcohol.  Avoid drinking large amounts of liquids with your meals.  Avoid bending over or lying down until 2-3 hours after eating. What foods are not recommended? These are some foods and drinks that may make your symptoms worse: Vegetables Tomatoes. Tomato juice. Tomato and spaghetti sauce. Chili peppers. Onion and garlic. Horseradish. Fruits Oranges, grapefruit, and lemon (fruit and juice). Meats High-fat meats, fish, and poultry. This includes hot dogs, ribs, ham, sausage, salami, and bacon. Dairy Whole milk and chocolate milk. Sour cream. Cream. Butter. Ice cream. Cream cheese. Drinks Coffee and tea. Bubbly (carbonated) drinks or energy drinks. Condiments Hot sauce. Barbecue sauce. Sweets/Desserts Chocolate and cocoa. Donuts. Peppermint and spearmint. Fats and Oils High-fat foods. This includes [place] fries and potato chips. Other Vinegar. Strong spices. This includes black pepper, white pepper, red pepper, cayenne, curry powder, cloves, ginger, and chili powder. The items listed above may not be a complete list of foods and drinks to avoid. Contact your dietitian for more information. This information is not intended to replace advice given to you by your health care provider. Make sure you discuss any questions you have with your health care provider. Document Released: 03/26/2012 Document Revised: 03/02/2016 Document Reviewed: 07/30/2013 Elsevier Interactive Patient Education  2017 [place].    Montelukast oral tablets What is this medicine? MONTELUKAST (mon te LOO kast) is used to prevent and treat the symptoms of asthma. It is also used to treat allergies. Do not use for an acute asthma attack. This medicine may be used  for other purposes; ask your health care provider or pharmacist if you have questions. COMMON BRAND NAME(S): Singulair What should I tell my health care provider before I take this medicine? They need to know if you have any of these conditions: -liver disease -an unusual or allergic reaction to montelukast, other medicines, foods, dyes, or preservatives -pregnant or trying to get pregnant -breast-feeding How should I use this medicine? This medicine should be given by mouth. Follow the directions on the prescription label. Take this medicine at the same time every day. You may take this medicine with or without meals. Do not chew the tablets. Do not stop taking your medicine unless your doctor tells you to. Talk to your pediatrician regarding the use of this medicine in children. Special care may be needed. While this drug may be prescribed for children as young as 89 years of age for selected conditions, precautions do apply. Overdosage: If you think you have taken too much of this medicine contact a poison control center or emergency room at once. NOTE: This medicine is only for you. Do not share this medicine with others. What if I miss a dose? If you miss a dose, take it as soon as you can. If it is almost time for your next dose, take only that dose.  Do not take double or extra doses. What may interact with this medicine? -anti-infectives like rifampin and rifabutin -medicines for diabetes like rosiglitazone and repaglinide -medicines for seizures like phenytoin, phenobarbital, and carbamazepine -paclitaxel This list may not describe all possible interactions. Give your health care provider a list of all the medicines, herbs, non-prescription drugs, or dietary supplements you use. Also tell them if you smoke, drink alcohol, or use illegal drugs. Some items may interact with your medicine. What should I watch for while using this medicine? Visit your doctor or health care professional for  regular checks on your progress. Tell your doctor or health care professional if your allergy or asthma symptoms do not improve. Take your medicine even when you do not have symptoms. Do not stop taking any of your medicine(s) unless your doctor tells you to. If you have asthma, talk to your doctor about what to do in an acute asthma attack. Always have your inhaled rescue medicine for asthma attacks with you. Patients and their families should watch for new or worsening thoughts of suicide or depression. Also watch for sudden changes in feelings such as feeling anxious, agitated, panicky, irritable, hostile, aggressive, impulsive, severely restless, overly excited and hyperactive, or not being able to sleep. Any worsening of mood or thoughts of suicide or dying should be reported to your health care professional right away. What side effects may I notice from receiving this medicine? Side effects that you should report to your doctor or health care professional as soon as possible: -allergic reactions like skin rash or hives, or swelling of the face, lips, or tongue -breathing problems -confusion -dark urine -fever or infection -flu-like symptoms -hallucinations -painful lumps under the skin -pain, tingling, numbness in the hands or feet -sinus pain or swelling -suicidal thoughts or other mood changes -tremors -trouble sleeping -uncontrolled muscle movements -unusual bleeding or bruising -yellowing of the eyes or skin Side effects that usually do not require medical attention (report to your doctor or health care professional if they continue or are bothersome): -cough -dizziness -drowsiness -headache -nightmares -stomach upset -stuffy nose This list may not describe all possible side effects. Call your doctor for medical advice about side effects. You may report side effects to FDA at 1-800-FDA-1088. Where should I keep my medicine? Keep out of the reach of children. Store at room  temperature between 15 and 30 degrees C (59 and 86 degrees F). Protect from light and moisture. Keep this medicine in the original bottle. Throw away any unused medicine after the expiration date. NOTE: This sheet is a summary. It may not cover all possible information. If you have questions about this medicine, talk to your doctor, pharmacist, or health care provider.  2018 Elsevier/Gold Standard (2015-09-27 09:40:44)

## 2018-04-29 NOTE — Progress Notes (Addendum)
Chief Complaint  Patient presents with  . Establish Care   New patient former PCP [facility] physician [place] she had labs 10/2017 and physical will request records  1. H/o stress, anxiety and mood d/o due to grandson who she used to take care of moving to [facility] and pending divorce PHQ 9 score 1 today  2. C/o allergies though taking otc medications had to give away her dog b/c though allergic  3. Tobacco abuse on wellbutrin to try to quit and prev had reaction to Chantix.  4. H/o thrombocytosis Jak 2 negative 11/12/17 and could be elevated 2/2 smoking CBC nl 03/29/18 reviewed plts 211  5. C/o left shoulder pain with abnormal MRI L shoulder c/w tendonitis/bursitis need to get report  6. H/o GERD 03/04/10 mild on barium swallow GERD causing sx's now and want to resume medication.    Review of Systems  Constitutional: Negative for weight loss.  HENT: Negative for hearing loss.   Eyes: Negative for blurred vision.  Respiratory: Negative for shortness of breath.   Cardiovascular: Negative for chest pain.  Gastrointestinal: Positive for heartburn.  Musculoskeletal: Positive for joint pain.  Skin: Negative for rash.  Neurological: Negative for headaches.  Psychiatric/Behavioral: Positive for depression. The patient is nervous/anxious.    Past Medical History:  Diagnosis Date  . Allergy   . Anxiety   . Arthritis   . DDD (degenerative disc disease), cervical   . Depression   . Polycythemia   . Polycythemia secondary to smoking 07/14/2014   Past Surgical History:  Procedure Laterality Date  . ABDOMINAL EXPLORATION SURGERY    . ABLATION     2018/uterine   . APPENDECTOMY     1984  . TUBAL LIGATION     1991   Family History  Problem Relation Age of Onset  . Cancer Mother        lung ca  . Mental illness Mother   . Clotting disorder Father        embolism  . Heart disease Father   . Hypertension Father   . Stroke Father   . Breast cancer Sister        Breast Cancer  . Alcohol abuse  Sister   . Arthritis Sister   . Asthma Sister   . Cancer Sister        ?breast   . Drug abuse Sister   . Alcohol abuse Brother   . Drug abuse Brother   . Asthma Daughter   . Cancer Maternal Aunt        lung cancer   . Cancer Maternal Grandmother        ? type   . Mental illness Maternal Grandmother   . Stroke Maternal Grandmother   . Heart disease Maternal Grandfather   . Heart disease Paternal Grandfather   . Arthritis Sister   . Cancer Sister        breast 21 doing well genetic testing neg per pt   . Cancer Maternal Aunt        bladder and colon cancer    Social History   Socioeconomic History  . Marital status: Unknown    Spouse name: Not on file  . Number of children: Not on file  . Years of education: Not on file  . Highest education level: Not on file  Occupational History  . Not on file  Social Needs  . Financial resource strain: Not on file  . Food insecurity:    Worry: Not on file  Inability: Not on file  . Transportation needs:    Medical: Not on file    Non-medical: Not on file  Tobacco Use  . Smoking status: Current Every Day Smoker    Packs/day: 1.00    Years: 28.00    Pack years: 28.00  . Smokeless tobacco: Never Used  . Tobacco comment: uses patches and wellbutrion   Substance and Sexual Activity  . Alcohol use: No  . Drug use: No  . Sexual activity: Not Currently  Lifestyle  . Physical activity:    Days per week: Not on file    Minutes per session: Not on file  . Stress: Not on file  Relationships  . Social connections:    Talks on phone: Not on file    Gets together: Not on file    Attends religious service: Not on file    Active member of club or organization: Not on file    Attends meetings of clubs or organizations: Not on file    Relationship status: Not on file  . Intimate partner violence:    Fear of current or ex partner: Not on file    Emotionally abused: Not on file    Physically abused: Not on file    Forced sexual  activity: Not on file  Other Topics Concern  . Not on file  Social History Narrative   College ed.    Administrative asst    No guns    Wears seat belt    3 kids    Pending divorce    From [place]    Current Meds  Medication Sig  . ALPRAZolam (XANAX) 1 MG tablet Take 1 mg by mouth 2 (two) times daily as needed.  Marland Kitchen buPROPion (WELLBUTRIN) 100 MG tablet Take 1 tablet (100 mg total) by mouth 2 (two) times daily.  . cetirizine (ZYRTEC) 10 MG tablet Take 10 mg by mouth daily.  Marland Kitchen DYMISTA 137-50 MCG/ACT SUSP   . ibuprofen (ADVIL,MOTRIN) 200 MG tablet Take 200 mg by mouth every 6 (six) hours as needed.  . metaxalone (SKELAXIN) 800 MG tablet Take by mouth as needed.  Marland Kitchen PROAIR HFA 108 (90 Base) MCG/ACT inhaler   . [DISCONTINUED] buPROPion (WELLBUTRIN) 100 MG tablet Take 100 mg by mouth 2 (two) times daily.   Allergies  Allergen Reactions  . Chantix [Varenicline] Other (See Comments)    Weird dreams, pressure headache, insomnia  . Flexeril [Cyclobenzaprine] Other (See Comments)    Makes me feel like a "zombie"  . Prednisone Other (See Comments)    Leg pain and increased blood pressure and headache   Recent Results (from the past 2160 hour(s))  CBC with Differential/Platelet     Status: None   Collection Time: 03/29/18  2:53 PM  Result Value Ref Range   WBC 5.8 3.6 - 11.0 K/uL   RBC 4.94 3.80 - 5.20 MIL/uL   Hemoglobin 15.8 12.0 - 16.0 g/dL   HCT 46.1 35.0 - 47.0 %   MCV 93.5 80.0 - 100.0 fL   MCH 32.1 26.0 - 34.0 pg   MCHC 34.4 32.0 - 36.0 g/dL   RDW 13.2 11.5 - 14.5 %   Platelets 211 150 - 440 K/uL   Neutrophils Relative % 48 %   Neutro Abs 2.8 1.4 - 6.5 K/uL   Lymphocytes Relative 42 %   Lymphs Abs 2.5 1.0 - 3.6 K/uL   Monocytes Relative 8 %   Monocytes Absolute 0.4 0.2 - 0.9 K/uL   Eosinophils  Relative 2 %   Eosinophils Absolute 0.1 0 - 0.7 K/uL   Basophils Relative 0 %   Basophils Absolute 0.0 0 - 0.1 K/uL    Comment: Performed at [facility], [address] [place], [place] [address]   Objective  Body mass index is 29.41 kg/m. Wt Readings from Last 3 Encounters:  04/29/18 182 lb 3.2 oz (82.6 kg)  03/29/18 186 lb 3.2 oz (84.5 kg)  11/26/17 188 lb 14.4 oz (85.7 kg)   Temp Readings from Last 3 Encounters:  04/29/18 98.4 F (36.9 C) (Oral)  03/29/18 (!) 96.5 F (35.8 C) (Tympanic)  11/26/17 (!) 97.3 F (36.3 C) (Tympanic)   BP Readings from Last 3 Encounters:  04/29/18 102/78  03/29/18 114/73  11/26/17 102/66   Pulse Readings from Last 3 Encounters:  04/29/18 74  03/29/18 75  11/26/17 72    Physical Exam  Constitutional: She is oriented to person, place, and time. Vital signs are normal. She appears well-developed and well-nourished. She is cooperative.  HENT:  Head: Normocephalic and atraumatic.  Mouth/Throat: Oropharynx is clear and moist and mucous membranes are normal.  Eyes: Pupils are equal, round, and reactive to light. Conjunctivae are normal.  Cardiovascular: Normal rate, regular rhythm and normal heart sounds.  Pulmonary/Chest: Effort normal and breath sounds normal.  Neurological: She is alert and oriented to person, place, and time. Gait normal.  Skin: Skin is warm, dry and intact.  Psychiatric: She has a normal mood and affect. Her speech is normal and behavior is normal. Judgment and thought content normal. Cognition and memory are normal.  Nursing note and vitals reviewed.   Assessment   1. Adjustment d/o with Stress/anxiety/depression though PHQ 9 score 1 today with lack of interested and motivation  -trigger grandson liver with her now moving to [place]  -pending divorce  2. Allergic rhinitis was no allergy shots previously  3. Tobacco abuse  4. Thrombocytosis resolved for now  5. GERD 6. Left shoulder pain  7. HM Plan   1.  -F/u with therapy  -Refilled lexapro and wellbutrin  -zoloft 05/2018 dizzy, lexapro helped in the past, effexor no help had w/o 2. otc antihistamines add singulair  3. rec  cessation  4. Monitor plts last 03/2018 211 normal  5. prilosec 20 mg qd  6.  Need to get copy of mri  7.  Flu shot had 10/52018 Tdap had 03/06/14 per [facility] notes had 2017 Td 10/15/15 left arm ? Year pnuemonia vaccine  -reviewed PCP records pna 73 had 02/10/10  Consider shingrix age 28 y.o   Colonoscopy due age 60 y.o 09/2018  Pap 10/22/15 neg pap neg HPV  Mammogram 08/28/17 3 cysts right breast  Ordered screening and [place] right breast Smoker currently   -1 pk last 3 days to 1 week max 1 ppd in the past x 2 years smoker since age 89 y.o FH mom and m aunt lung ca  Referred dermatology [facility] h/o LN2 to lip  Tsh normal 11/12/17  Declines HIV testing  GCT negative 10/15/15 hcv neg 10/15/15 RPR neg 10/15/15  hiv neg 10/15/15    Need to get records and most recent labs [facility] physicians [place] had physical 10/2017 obtained records and reviewed:  -last seen 10/31/17 annual  H/o HLD H/o polycythemia  H/o HLD  H/o anxiety, pmdd, GAD  Pelvic and transvaginal US 11/13/17 3.1 cm uterine fibroid, left ovary not seen otherwise normal   Neck pain at one pt considered cervical MRI due  to pain  H/o impingement syndrome L shoulder (Dr. Carollee Leitz)  H/o benign microscopic hematuria w/u Dr. Pilar Jarvis neg 2017  DUB 2/17 saw Dr. Abran Cantor s/p ablation and pelvic US wnl per notes   A1C 5.4 10/31/17  TC 218, TG 155, LDL 147, HDL 40  Ferritin 39.1 normal  CMET nl Cr 0.86 GFR 70  hbg 16.5/hct 50.0 both high nl 16 and 47.0  FSH 14.4 and LH 6.1 both normal   Ask at f/u if on effexor 37.5 as well should not be on wellbutrin and effexor  Eye Dr. Nicki Reaper [facility]  Dentist Dr. Bethena Roys walker  Former PCP Dr. Justin Mend 10/2017   Provider: Dr. Olivia Mackie [facility]

## 2018-05-01 NOTE — Progress Notes (Signed)
Imms have been added to her chart.

## 2018-05-03 ENCOUNTER — Encounter: Payer: Self-pay | Admitting: Internal Medicine

## 2018-06-11 ENCOUNTER — Other Ambulatory Visit: Payer: Self-pay | Admitting: Internal Medicine

## 2018-06-11 DIAGNOSIS — N6001 Solitary cyst of right breast: Secondary | ICD-10-CM

## 2018-07-11 ENCOUNTER — Telehealth: Payer: Self-pay

## 2018-07-11 NOTE — Telephone Encounter (Signed)
Copied from [place] [id number]. Topic: Referral - Question >> Jul 11, 2018 11:37 AM Rutherford Nail, NT wrote: Reason for CRM: Patient would like to know if someone could give [facility] a call and schedule an appointment for her? States that they keep missing each others calls and have not been able to get in contact with each other. Patient is okay with the appointment being made any time and she could work her schedule around it.

## 2018-07-17 ENCOUNTER — Ambulatory Visit
Admission: RE | Admit: 2018-07-17 | Discharge: 2018-07-17 | Disposition: A | Discharge status: Home / Self Care | Payer: BLUE CROSS/BLUE SHIELD | Source: Ambulatory Visit | Attending: Internal Medicine | Admitting: Internal Medicine

## 2018-07-17 ENCOUNTER — Encounter (HOSPITAL_COMMUNITY): Payer: Self-pay

## 2018-07-17 DIAGNOSIS — N6001 Solitary cyst of right breast: Secondary | ICD-10-CM

## 2018-07-30 ENCOUNTER — Ambulatory Visit: Payer: BLUE CROSS/BLUE SHIELD | Admitting: Internal Medicine

## 2018-07-30 ENCOUNTER — Encounter: Payer: Self-pay | Admitting: Internal Medicine

## 2018-07-30 VITALS — BP 100/64 | HR 84 | Temp 98.8°F | Ht 66.0 in | Wt 180.8 lb

## 2018-07-30 DIAGNOSIS — F329 Major depressive disorder, single episode, unspecified: Secondary | ICD-10-CM

## 2018-07-30 DIAGNOSIS — M25512 Pain in left shoulder: Secondary | ICD-10-CM

## 2018-07-30 DIAGNOSIS — N938 Other specified abnormal uterine and vaginal bleeding: Secondary | ICD-10-CM

## 2018-07-30 DIAGNOSIS — F419 Anxiety disorder, unspecified: Secondary | ICD-10-CM | POA: Diagnosis not present

## 2018-07-30 DIAGNOSIS — D751 Secondary polycythemia: Secondary | ICD-10-CM

## 2018-07-30 DIAGNOSIS — F32A Depression, unspecified: Secondary | ICD-10-CM

## 2018-07-30 DIAGNOSIS — G8929 Other chronic pain: Secondary | ICD-10-CM

## 2018-07-30 MED ORDER — BUPROPION HCL ER (XL) 150 MG PO TB24: 150.0000 mg | ORAL_TABLET | Freq: Every day | ORAL | 2 refills | Status: DC

## 2018-07-30 MED ORDER — CLONAZEPAM 1 MG PO TABS: 0.5000 mg | ORAL_TABLET | Freq: Two times a day (BID) | ORAL | 2 refills | Status: DC | PRN

## 2018-07-30 MED ORDER — METAXALONE 800 MG PO TABS: 400.0000 mg | ORAL_TABLET | Freq: Every day | ORAL | 2 refills | Status: DC | PRN

## 2018-07-30 NOTE — Patient Instructions (Signed)
Rec. Call [facility] if abnormal vaginal bleeding happens again  Abnormal Uterine Bleeding Abnormal uterine bleeding can affect women at various stages in life, including teenagers, women in their reproductive years, pregnant women, and women who have reached menopause. Several kinds of uterine bleeding are considered abnormal, including:  Bleeding or spotting between periods.  Bleeding after sexual intercourse.  Bleeding that is heavier or more than normal.  Periods that last longer than usual.  Bleeding after menopause.  Many cases of abnormal uterine bleeding are minor and simple to treat, while others are more serious. Any type of abnormal bleeding should be evaluated by your health care provider. Treatment will depend on the cause of the bleeding. Follow these instructions at home: Monitor your condition for any changes. The following actions may help to alleviate any discomfort you are experiencing:  Avoid the use of tampons and douches as directed by your health care provider.  Change your pads frequently.  You should get regular pelvic exams and Pap tests. Keep all follow-up appointments for diagnostic tests as directed by your health care provider. Contact a health care provider if:  Your bleeding lasts more than 1 week.  You feel dizzy at times. Get help right away if:  You pass out.  You are changing pads every 15 to 30 minutes.  You have abdominal pain.  You have a fever.  You become sweaty or weak.  You are passing large blood clots from the vagina.  You start to feel nauseous and vomit. This information is not intended to replace advice given to you by your health care provider. Make sure you discuss any questions you have with your health care provider. Document Released: 09/25/2005 Document Revised: 03/08/2016 Document Reviewed: 04/24/2013 Elsevier Interactive Patient Education  2017 [employer].

## 2018-07-30 NOTE — Progress Notes (Signed)
Pre visit review using our clinic review tool, if applicable. No additional management support is needed unless otherwise documented below in the visit note. 

## 2018-07-30 NOTE — Progress Notes (Signed)
Chief Complaint  Patient presents with  . Follow-up   F/u  1. Anxiety and depression due to divorce finalized 07/2018 [place] went [place] [facility] but did not find it effective. Did not take lexapro 10 in combo with singulair made her feel loopy, on wellbutrin 100 mg taking qd instead of bid, on xanax 1 mg qd. She reports she is still having anxiety and panic with all the changes in her life and unsure if will stay here or move to [place] or [place] 2. C/o DUB with clots last period with 5 days of bleeding Sat-Tues and pelvic pain advised f/u with [facility]  3. Chronic shoulder neck pain would like refill of Skelaxin 800 mg prn rarely uses   Review of Systems  Constitutional: Negative for weight loss.  HENT: Negative for hearing loss.   Eyes: Negative for blurred vision.  Respiratory: Negative for shortness of breath.   Cardiovascular: Negative for chest pain.  Gastrointestinal: Negative for abdominal pain.  Genitourinary:       +DUB  Skin: Negative for rash.  Neurological: Negative for headaches.  Psychiatric/Behavioral: Positive for depression. The patient is nervous/anxious.    Past Medical History:  Diagnosis Date  . Allergy   . Anxiety   . Arthritis   . DDD (degenerative disc disease), cervical   . Depression   . Polycythemia   . Polycythemia secondary to smoking 07/14/2014   Past Surgical History:  Procedure Laterality Date  . ABDOMINAL EXPLORATION SURGERY    . ABLATION     2018/uterine   . APPENDECTOMY     1984  . TUBAL LIGATION     1991   Family History  Problem Relation Age of Onset  . Cancer Mother        lung ca  . Mental illness Mother   . Clotting disorder Father        embolism  . Heart disease Father   . Hypertension Father   . Stroke Father   . Breast cancer Sister        Breast Cancer  . Alcohol abuse Sister   . Arthritis Sister   . Asthma Sister   . Cancer Sister        ?breast   . Drug abuse Sister   . Alcohol abuse Brother   . Drug abuse  Brother   . Asthma Daughter   . Cancer Maternal Aunt        lung cancer   . Cancer Maternal Grandmother        ? type   . Mental illness Maternal Grandmother   . Stroke Maternal Grandmother   . Heart disease Maternal Grandfather   . Heart disease Paternal Grandfather   . Arthritis Sister   . Cancer Sister        breast 51 doing well genetic testing neg per pt   . Cancer Maternal Aunt        bladder and colon cancer    Social History   Socioeconomic History  . Marital status: Unknown    Spouse name: Not on file  . Number of children: Not on file  . Years of education: Not on file  . Highest education level: Not on file  Occupational History  . Not on file  Social Needs  . Financial resource strain: Not on file  . Food insecurity:    Worry: Not on file    Inability: Not on file  . Transportation needs:    Medical:  Not on file    Non-medical: Not on file  Tobacco Use  . Smoking status: Current Every Day Smoker    Packs/day: 1.00    Years: 28.00    Pack years: 28.00  . Smokeless tobacco: Never Used  . Tobacco comment: uses patches and wellbutrion   Substance and Sexual Activity  . Alcohol use: No  . Drug use: No  . Sexual activity: Not Currently  Lifestyle  . Physical activity:    Days per week: Not on file    Minutes per session: Not on file  . Stress: Not on file  Relationships  . Social connections:    Talks on phone: Not on file    Gets together: Not on file    Attends religious service: Not on file    Active member of club or organization: Not on file    Attends meetings of clubs or organizations: Not on file    Relationship status: Not on file  . Intimate partner violence:    Fear of current or ex partner: Not on file    Emotionally abused: Not on file    Physically abused: Not on file    Forced sexual activity: Not on file  Other Topics Concern  . Not on file  Social History Narrative   College ed.    Administrative asst    No guns    Wears seat  belt    3 kids    Pending divorce    From [place]    Current Meds  Medication Sig  . ALPRAZolam (XANAX) 1 MG tablet Take 1 mg by mouth 2 (two) times daily as needed.  Marland Kitchen buPROPion (WELLBUTRIN) 100 MG tablet Take 1 tablet (100 mg total) by mouth 2 (two) times daily.  . cetirizine (ZYRTEC) 10 MG tablet Take 10 mg by mouth daily.  Marland Kitchen DYMISTA 137-50 MCG/ACT SUSP   . escitalopram (LEXAPRO) 10 MG tablet Take 1 tablet (10 mg total) by mouth daily.  Marland Kitchen ibuprofen (ADVIL,MOTRIN) 200 MG tablet Take 200 mg by mouth every 6 (six) hours as needed.  . metaxalone (SKELAXIN) 800 MG tablet Take by mouth as needed.  . montelukast (SINGULAIR) 10 MG tablet Take 1 tablet (10 mg total) by mouth at bedtime.  Marland Kitchen omeprazole (PRILOSEC) 20 MG capsule Take 1 capsule (20 mg total) by mouth daily. 30 minutes before food  . PROAIR HFA 108 (90 Base) MCG/ACT inhaler    Allergies  Allergen Reactions  . Chantix [Varenicline] Other (See Comments)    Weird dreams, pressure headache, insomnia  . Flexeril [Cyclobenzaprine] Other (See Comments)    Makes me feel like a "zombie"  . Prednisone Other (See Comments)    Leg pain and increased blood pressure and headache   No results found for this or any previous visit (from the past 2160 hour(s)). Objective  Body mass index is 29.18 kg/m. Wt Readings from Last 3 Encounters:  07/30/18 180 lb 12.8 oz (82 kg)  04/29/18 182 lb 3.2 oz (82.6 kg)  03/29/18 186 lb 3.2 oz (84.5 kg)   Temp Readings from Last 3 Encounters:  07/30/18 98.8 F (37.1 C) (Oral)  04/29/18 98.4 F (36.9 C) (Oral)  03/29/18 (!) 96.5 F (35.8 C) (Tympanic)   BP Readings from Last 3 Encounters:  07/30/18 100/64  04/29/18 102/78  03/29/18 114/73   Pulse Readings from Last 3 Encounters:  07/30/18 84  04/29/18 74  03/29/18 75    Physical Exam  Constitutional: She is oriented to  person, place, and time. Vital signs are normal. She appears well-developed and well-nourished. She is cooperative.   HENT:  Head: Normocephalic and atraumatic.  Mouth/Throat: Oropharynx is clear and moist and mucous membranes are normal.  Eyes: Pupils are equal, round, and reactive to light. Conjunctivae are normal.  Cardiovascular: Normal rate, regular rhythm and normal heart sounds.  Pulmonary/Chest: Effort normal and breath sounds normal.  Neurological: She is alert and oriented to person, place, and time.  Skin: Skin is warm, dry and intact.  Psychiatric: Her speech is normal and behavior is normal. Judgment and thought content normal. Cognition and memory are normal. She exhibits a depressed mood.  Tearful on exam   Nursing note and vitals reviewed.   Assessment   1. Anxiety and depression  2. DUB, female pelvic pain  3. Chronic shoulder, neck pain 4. HM Plan   1. wellbutrin xl 150 increase to 300 if 150 not effective after 2 weeks  D/c xanax 1 mg qd change klonopin 0.5 mg bid  Given list of other therapists 2. rec call [facility] to f/u 3. Refilled skelaxin 400-800 prn qd 4. Flu shot had 07/13/2017 will need to get  Tdap had 03/06/14 per [facility] notes had 2017 Td 10/15/15 left arm ? Year pnuemonia vaccine smoker due agani  -reviewed PCP records pna 59 had 02/10/10  Consider shingrix age 65 y.o   Colonoscopy due age 16 y.o 09/2018  Pap 10/22/15 neg pap neg HPV [facility] 08/28/17 3 cysts right breast  Ordered screening and [place] right breast and mammogram ordered 08/2018 Smoker currently   -1 pk last 3 days to 1 week max 1 ppd in the past x 2 years smoker since age 24 y.o FH mom and m aunt lung ca  Referred dermatology [facility]se h/o LN2 to lip appt 09/17/18   Tsh normal 11/12/17  Declines HIV testing  GCT negative 10/15/15 hcv neg 10/15/15 RPR neg 10/15/15  hiv neg 10/15/15    Need to get records and most recent labs [facility] had physical 10/2017 obtained records and reviewed:  -last seen 10/31/17 annual  H/o HLD H/o polycythemia  H/o HLD  H/o anxiety, pmdd, GAD   Pelvic and transvaginal US 11/13/17 3.1 cm uterine fibroid, left ovary not seen otherwise normal   Neck pain at one pt considered cervical MRI due to pain  H/o impingement syndrome L shoulder (Dr. Carollee Leitz)  H/o benign microscopic hematuria w/u Dr. Pilar Jarvis neg 2017  DUB 2/17 saw Dr. Abran Cantor s/p ablation and pelvic US wnl per notes   A1C 5.4 10/31/17  TC 218, TG 155, LDL 147, HDL 40  Ferritin 39.1 normal  CMET nl Cr 0.86 GFR 70  hbg 16.5/hct 50.0 both high nl 16 and 47.0  FSH 14.4 and LH 6.1 both normal   Ask at f/u if on effexor 37.5 as well should not be on wellbutrin and effexor  Eye Dr. Nicki Reaper [facility]  Dentist Dr. Bethena Roys walker  Former PCP Dr. Justin Mend 10/2017   Provider: Dr. Olivia Mackie McLean-Scocuzza-[facility]

## 2018-07-31 ENCOUNTER — Encounter: Payer: Self-pay | Admitting: Internal Medicine

## 2018-07-31 DIAGNOSIS — F419 Anxiety disorder, unspecified: Principal | ICD-10-CM

## 2018-07-31 DIAGNOSIS — F329 Major depressive disorder, single episode, unspecified: Secondary | ICD-10-CM | POA: Insufficient documentation

## 2018-07-31 DIAGNOSIS — N938 Other specified abnormal uterine and vaginal bleeding: Secondary | ICD-10-CM | POA: Insufficient documentation

## 2018-08-06 ENCOUNTER — Ambulatory Visit: Payer: Self-pay | Admitting: *Deleted

## 2018-08-06 NOTE — Telephone Encounter (Signed)
Stop klonopin  She can try bland diet and peptobismol for upset stomach  What does she want to try for anxiety go back on previous medication similar to klonopin but not klonopin?   [place]

## 2018-08-06 NOTE — Telephone Encounter (Signed)
Pt called with having starting the clonazepam on Saturday at 0.5 mg that day and Sunday and Monday.  Since that time she has no appetite, nausea, fatigued and low abd discomfort.  She started having diarrhea today. The stools are watery. She has had insomnia since Sunday. She has anxiety because of going thru a divorce. So sometimes her anxiety will show up with an upset stomach. Appointment scheduled per protocol.  She is requesting a call back from her provider today regarding the clonazepam. Advised to call back for increase in her symptoms, pt voiced understanding.  Reason for Disposition . [1] MILD diarrhea (e.g., 1-3 or more stools than normal in past 24 hours) without known cause AND [2] present >  7 days    3 stools this morning, with fatigue, no appetite. Started on new medication recently.  Answer Assessment - Initial Assessment Questions 1. DIARRHEA SEVERITY: "How bad is the diarrhea?" "How many extra stools have you had in the past 24 hours than normal?"    - NO DIARRHEA (SCALE 0)   - MILD (SCALE 1-3): Few loose or mushy BMs; increase of 1-3 stools over normal daily number of stools; mild increase in ostomy output.   -  MODERATE (SCALE 4-7): Increase of 4-6 stools daily over normal; moderate increase in ostomy output. * SEVERE (SCALE 8-10; OR 'WORST POSSIBLE'): Increase of 7 or more stools daily over normal; moderate increase in ostomy output; incontinence.     3 stools today 2. ONSET: "When did the diarrhea begin?"      today 3. BM CONSISTENCY: "How loose or watery is the diarrhea?"      watery 4. VOMITING: "Are you also vomiting?" If so, ask: "How many times in the past 24 hours?"      nauseated 5. ABDOMINAL PAIN: "Are you having any abdominal pain?" If yes: "What does it feel like?" (e.g., crampy, dull, intermittent, constant)      Lower  abd pain across abd 6. ABDOMINAL PAIN SEVERITY: If present, ask: "How bad is the pain?"  (e.g., Scale 1-10; mild, moderate, or severe)   - MILD (1-3): doesn't interfere with normal activities, abdomen soft and not tender to touch    - MODERATE (4-7): interferes with normal activities or awakens from sleep, tender to touch    - SEVERE (8-10): excruciating pain, doubled over, unable to do any normal activities       Discomfort, comes and goes, dull feels unsettled 7. ORAL INTAKE: If vomiting, "Have you been able to drink liquids?" "How much fluids have you had in the past 24 hours?"     Yes, gatorade 8. HYDRATION: "Any signs of dehydration?" (e.g., dry mouth [not just dry lips], too weak to stand, dizziness, new weight loss) "When did you last urinate?"     no 9. EXPOSURE: "Have you traveled to a foreign country recently?" "Have you been exposed to anyone with diarrhea?" "Could you have eaten any food that was spoiled?"     no 10. ANTIBIOTIC USE: "Are you taking antibiotics now or have you taken antibiotics in the past 2 months?"       no 11. OTHER SYMPTOMS: "Do you have any other symptoms?" (e.g., fever, blood in stool)       no 12. PREGNANCY: "Is there any chance you are pregnant?" "When was your last menstrual period?"       LMP spotting since Sunday night, had ablation  Protocols used: DIARRHEA-A-AH

## 2018-08-07 ENCOUNTER — Ambulatory Visit: Payer: BLUE CROSS/BLUE SHIELD | Admitting: Internal Medicine

## 2018-08-07 NOTE — Telephone Encounter (Signed)
Will discuss at appointment today.

## 2018-08-08 ENCOUNTER — Telehealth: Payer: Self-pay

## 2018-08-08 ENCOUNTER — Other Ambulatory Visit: Payer: Self-pay | Admitting: Internal Medicine

## 2018-08-08 DIAGNOSIS — F419 Anxiety disorder, unspecified: Secondary | ICD-10-CM

## 2018-08-08 MED ORDER — ALPRAZOLAM 0.5 MG PO TABS: 0.5000 mg | ORAL_TABLET | Freq: Two times a day (BID) | ORAL | 0 refills | Status: DC | PRN

## 2018-08-08 NOTE — Telephone Encounter (Signed)
Patient no showed appointment

## 2018-08-08 NOTE — Telephone Encounter (Signed)
Left message for patient to return call back. [facility] may give and obtain information.  

## 2018-08-08 NOTE — Telephone Encounter (Signed)
Sent xanax 0.5 2x per day as needed   [place]

## 2018-08-08 NOTE — Telephone Encounter (Signed)
Patient called, left VM to return call to the office for message from Dr. McLean-Scocuzza. 

## 2018-08-08 NOTE — Telephone Encounter (Signed)
Copied from [place] [id number]. Topic: General - Other >> Aug 08, 2018 10:39 AM Janace Aris A wrote: Reason for CRM: PT called back to return a message, the pt says she did stop the klonopin, her last dose was on Monday. Also she preferred to start the medication xanax for anxiety. She says she has a low tolerennce for medication but she has never had any abnormal reactions to that medication.   Pt wanted to advise Dr. Olivia Mackie her stomach problems has subsided, she figured she just had a GI bug.

## 2018-08-09 NOTE — Telephone Encounter (Signed)
Left message for patient to return call back. [facility] may give information.  

## 2018-08-28 ENCOUNTER — Other Ambulatory Visit: Payer: Self-pay | Admitting: Internal Medicine

## 2018-08-28 ENCOUNTER — Telehealth: Payer: Self-pay

## 2018-08-28 DIAGNOSIS — N938 Other specified abnormal uterine and vaginal bleeding: Secondary | ICD-10-CM

## 2018-08-28 DIAGNOSIS — R102 Pelvic and perineal pain: Secondary | ICD-10-CM

## 2018-08-28 NOTE — Telephone Encounter (Signed)
Copied from [place] [phone] [phone]. Topic: Referral - Request for Referral >> Aug 28, 2018  1:38 PM Yvette Rack wrote: Has patient seen PCP for this complaint? yes  *If NO, is insurance requiring patient see PCP for this issue before PCP can refer them? Referral for which specialty: Pt requests referral to Gynecologist  Preferred provider/office: No specific provider Reason for referral: ongoing personal issue that was discussed with Dr. Olivia Mackie

## 2018-08-28 NOTE — Telephone Encounter (Signed)
Refer Dr. Blima Rich [place]  DUB and female pelvic pain   [place]

## 2018-09-11 ENCOUNTER — Other Ambulatory Visit: Payer: Self-pay | Admitting: Internal Medicine

## 2018-09-11 DIAGNOSIS — F419 Anxiety disorder, unspecified: Secondary | ICD-10-CM

## 2018-09-11 MED ORDER — ALPRAZOLAM 0.5 MG PO TABS: 0.5000 mg | ORAL_TABLET | Freq: Two times a day (BID) | ORAL | 2 refills | Status: DC | PRN

## 2018-11-01 ENCOUNTER — Encounter: Payer: Self-pay | Admitting: Internal Medicine

## 2018-11-01 ENCOUNTER — Ambulatory Visit (INDEPENDENT_AMBULATORY_CARE_PROVIDER_SITE_OTHER): Payer: 59 | Admitting: Internal Medicine

## 2018-11-01 ENCOUNTER — Encounter: Payer: Self-pay | Admitting: Gastroenterology

## 2018-11-01 VITALS — BP 106/70 | HR 93 | Temp 98.6°F | Resp 17 | Ht 66.0 in | Wt 174.8 lb

## 2018-11-01 DIAGNOSIS — N938 Other specified abnormal uterine and vaginal bleeding: Secondary | ICD-10-CM

## 2018-11-01 DIAGNOSIS — R102 Pelvic and perineal pain: Secondary | ICD-10-CM

## 2018-11-01 DIAGNOSIS — F419 Anxiety disorder, unspecified: Secondary | ICD-10-CM | POA: Diagnosis not present

## 2018-11-01 DIAGNOSIS — Z1211 Encounter for screening for malignant neoplasm of colon: Secondary | ICD-10-CM | POA: Diagnosis not present

## 2018-11-01 DIAGNOSIS — M25511 Pain in right shoulder: Secondary | ICD-10-CM

## 2018-11-01 DIAGNOSIS — R1013 Epigastric pain: Secondary | ICD-10-CM | POA: Insufficient documentation

## 2018-11-01 DIAGNOSIS — K219 Gastro-esophageal reflux disease without esophagitis: Secondary | ICD-10-CM

## 2018-11-01 DIAGNOSIS — F329 Major depressive disorder, single episode, unspecified: Secondary | ICD-10-CM

## 2018-11-01 MED ORDER — PANTOPRAZOLE SODIUM 40 MG PO TBEC: 40.0000 mg | DELAYED_RELEASE_TABLET | Freq: Every day | ORAL | 3 refills | Status: AC

## 2018-11-01 MED ORDER — BUPROPION HCL ER (XL) 150 MG PO TB24: 150.0000 mg | ORAL_TABLET | Freq: Every day | ORAL | 2 refills | Status: DC

## 2018-11-01 MED ORDER — ALPRAZOLAM 1 MG PO TABS: 1.0000 mg | ORAL_TABLET | Freq: Two times a day (BID) | ORAL | 2 refills | Status: DC | PRN

## 2018-11-01 NOTE — Patient Instructions (Signed)
Food Choices for Gastroesophageal Reflux Disease, Adult When you have gastroesophageal reflux disease (GERD), the foods you eat and your eating habits are very important. Choosing the right foods can help ease your discomfort. Think about working with a nutrition specialist (dietitian) to help you make good choices. What are tips for following this plan?  Meals  Choose healthy foods that are low in fat, such as fruits, vegetables, whole grains, low-fat dairy products, and lean meat, fish, and poultry.  Eat small meals often instead of 3 large meals a day. Eat your meals slowly, and in a place where you are relaxed. Avoid bending over or lying down until 2-3 hours after eating.  Avoid eating meals 2-3 hours before bed.  Avoid drinking a lot of liquid with meals.  Cook foods using methods other than frying. Bake, grill, or broil food instead.  Avoid or limit: ? Chocolate. ? Peppermint or spearmint. ? Alcohol. ? Pepper. ? Black and decaffeinated coffee. ? Black and decaffeinated tea. ? Bubbly (carbonated) soft drinks. ? Caffeinated energy drinks and soft drinks.  Limit high-fat foods such as: ? Fatty meat or fried foods. ? Whole milk, cream, butter, or ice cream. ? Nuts and nut butters. ? Pastries, donuts, and sweets made with butter or shortening.  Avoid foods that cause symptoms. These foods may be different for everyone. Common foods that cause symptoms include: ? Tomatoes. ? Oranges, lemons, and limes. ? Peppers. ? Spicy food. ? Onions and garlic. ? Vinegar. Lifestyle  Maintain a healthy weight. Ask your doctor what weight is healthy for you. If you need to lose weight, work with your doctor to do so safely.  Exercise for at least 30 minutes for 5 or more days each week, or as told by your doctor.  Wear loose-fitting clothes.  Do not smoke. If you need help quitting, ask your doctor.  Sleep with the head of your bed higher than your feet. Use a wedge under the  mattress or blocks under the bed frame to raise the head of the bed. Summary  When you have gastroesophageal reflux disease (GERD), food and lifestyle choices are very important in easing your symptoms.  Eat small meals often instead of 3 large meals a day. Eat your meals slowly, and in a place where you are relaxed.  Limit high-fat foods such as fatty meat or fried foods.  Avoid bending over or lying down until 2-3 hours after eating.  Avoid peppermint and spearmint, caffeine, alcohol, and chocolate. This information is not intended to replace advice given to you by your health care provider. Make sure you discuss any questions you have with your health care provider. Document Released: 03/26/2012 Document Revised: 10/31/2016 Document Reviewed: 10/31/2016 Elsevier Interactive Patient Education  2019 [employer] Inc[employer]  Panic Attack A panic attack is a sudden episode of severe anxiety, fear, or discomfort that causes physical and emotional symptoms. The attack may be in response to something frightening, or it may occur for no known reason. Symptoms of a panic attack can be similar to symptoms of a heart attack or stroke. It is important to see your health care provider when you have a panic attack so that these conditions can be ruled out. A panic attack is a symptom of another condition. Most panic attacks go away with treatment of the underlying problem. If you have panic attacks often, you may have a condition called panic disorder. What are the causes? A panic attack may be caused by:  An extreme,  life-threatening situation, such as a war or natural disaster.  An anxiety disorder, such as post-traumatic stress disorder.  Depression.  Certain medical conditions, including heart problems, neurological conditions, and infections.  Certain over-the-counter and prescription medicines.  Illegal drugs that increase heart rate and blood pressure, such as  methamphetamine.  Alcohol.  Supplements that increase anxiety.  Panic disorder. What increases the risk? You are more likely to develop this condition if:  You have an anxiety disorder.  You have another mental health condition.  You take certain medicines.  You use alcohol, illegal drugs, or other substances.  You are under extreme stress.  A life event is causing increased feelings of anxiety and depression. What are the signs or symptoms? A panic attack starts suddenly, usually lasts about 20 minutes, and occurs with one or more of the following:  A pounding heart.  A feeling that your heart is beating irregularly or faster than normal (palpitations).  Sweating.  Trembling or shaking.  Shortness of breath or feeling smothered.  Feeling choked.  Chest pain or discomfort.  Nausea or a strange feeling in your stomach.  Dizziness, feeling lightheaded, or feeling like you might faint.  Chills or hot flashes.  Numbness or tingling in your lips, hands, or feet.  Feeling confused, or feeling that you are not yourself.  Fear of losing control or being emotionally unstable.  Fear of dying. How is this diagnosed? A panic attack is diagnosed with an assessment by your health care provider. During the assessment your health care provider will ask questions about:  Your history of anxiety, depression, and panic attacks.  Your medical history.  Whether you drink alcohol, use illegal drugs, take supplements, or take medicines. Be honest about your substance use. Your health care provider may also:  Order blood tests or other kinds of tests to rule out serious medical conditions.  Refer you to a mental health professional for further evaluation. How is this treated? Treatment depends on the cause of the panic attack:  If the cause is a medical problem, your health care provider will either treat that problem or refer you to a specialist.  If the cause is  emotional, you may be given anti-anxiety medicines or referred to a counselor. These medicines may reduce how often attacks happen, reduce how severe the attacks are, and lower anxiety.  If the cause is a medicine, your health care provider may tell you to stop the medicine, change your dose, or take a different medicine.  If the cause is a drug, treatment may involve letting the drug wear off and taking medicine to help the drug leave your body or to counteract its effects. Attacks caused by drug abuse may continue even if you stop using the drug. Follow these instructions at home:  Take over-the-counter and prescription medicines only as told by your health care provider.  If you feel anxious, limit your caffeine intake.  Take good care of your physical and mental health by: ? Eating a balanced diet that includes plenty of fresh fruits and vegetables, whole grains, lean meats, and low-fat dairy. ? Getting plenty of rest. Try to get 7-8 hours of uninterrupted sleep each night. ? Exercising regularly. Try to get 30 minutes of physical activity at least 5 days a week. ? Not smoking. Talk to your health care provider if you need help quitting. ? Limiting alcohol intake to no more than 1 drink a day for nonpregnant women and 2 drinks a day for men.  One drink equals 12 oz of beer, 5 oz of wine, or 1 oz of hard liquor.  Keep all follow-up visits as told by your health care provider. This is important. Panic attacks may have underlying physical or emotional problems that take time to accurately diagnose. Contact a health care provider if:  Your symptoms do not improve, or they get worse.  You are not able to take your medicine as prescribed because of side effects. Get help right away if:  You have serious thoughts about hurting yourself or others.  You have symptoms of a panic attack. Do not drive yourself to the hospital. Have someone else drive you or call an ambulance. If you ever feel  like you may hurt yourself or others, or you have thoughts about taking your own life, get help right away. You can go to your nearest emergency department or call:  Your local emergency services (911 in the [place]  A suicide crisis helpline, such as the [facility] at 918-874-9802. This is open 24 hours a day. Summary  A panic attack is a sign of a serious health or mental health condition. Get help right away. Do not drive yourself to the hospital. Have someone else drive you or call an ambulance.  Always see a health care provider to have the reasons for the panic attack correctly diagnosed.  If your panic attack was caused by a physical problem, follow your health care provider's suggestions for medicine, referral to a specialist, and lifestyle changes.  If your panic attack was caused by an emotional problem, follow through with counseling from a qualified mental health specialist.  If you feel like you may hurt yourself or others, call 911 and get help right away. This information is not intended to replace advice given to you by your health care provider. Make sure you discuss any questions you have with your health care provider. Document Released: 09/25/2005 Document Revised: 11/03/2016 Document Reviewed: 11/03/2016 Elsevier Interactive Patient Education  2019 [employer].

## 2018-11-01 NOTE — Progress Notes (Addendum)
Chief Complaint  Patient presents with  . Follow-up    anxiety  . Depression   F/u  1. C/o female pelvic pain RLQ, abnormal menses/spotting cramping and DUB never saw OB/GYN  2. Anxiety worse and having panic issues wants to increase xanax to 1 mg bid and did not pick up wellbutrin wants to wait on psych referral and therapist due to cost. Depression is beter  3. GERD not helped with prilosec 20 mg qd  4. Right shoulder pain skelaxin gave diarrhea so she stopped shoulder pain ok for now   Review of Systems  Constitutional: Negative for weight loss.  HENT: Negative for hearing loss.   Eyes: Negative for blurred vision.  Respiratory: Negative for shortness of breath.   Cardiovascular: Negative for chest pain.  Gastrointestinal: Positive for abdominal pain and heartburn.  Genitourinary:       +female pelvic pain    Musculoskeletal: Negative for falls.  Skin: Negative for rash.  Neurological: Negative for headaches.  Psychiatric/Behavioral: Negative for depression. The patient is nervous/anxious.    Past Medical History:  Diagnosis Date  . Allergy   . Anxiety   . Arthritis   . DDD (degenerative disc disease), cervical   . Depression   . Polycythemia   . Polycythemia secondary to smoking 07/14/2014   Past Surgical History:  Procedure Laterality Date  . ABDOMINAL EXPLORATION SURGERY    . ABLATION     2018/uterine   . APPENDECTOMY     1984  . TUBAL LIGATION     1991   Family History  Problem Relation Age of Onset  . Cancer Mother        lung ca  . Mental illness Mother   . Clotting disorder Father        embolism  . Heart disease Father   . Hypertension Father   . Stroke Father   . Breast cancer Sister        Breast Cancer  . Alcohol abuse Sister   . Arthritis Sister   . Asthma Sister   . Cancer Sister        ?breast   . Drug abuse Sister   . Alcohol abuse Brother   . Drug abuse Brother   . Asthma Daughter   . Cancer Maternal Aunt        lung cancer   .  Cancer Maternal Grandmother        ? type   . Mental illness Maternal Grandmother   . Stroke Maternal Grandmother   . Heart disease Maternal Grandfather   . Heart disease Paternal Grandfather   . Arthritis Sister   . Cancer Sister        breast 61 doing well genetic testing neg per pt   . Cancer Maternal Aunt        bladder and colon cancer    Social History   Socioeconomic History  . Marital status: Unknown    Spouse name: Not on file  . Number of children: Not on file  . Years of education: Not on file  . Highest education level: Not on file  Occupational History  . Not on file  Social Needs  . Financial resource strain: Not on file  . Food insecurity:    Worry: Not on file    Inability: Not on file  . Transportation needs:    Medical: Not on file    Non-medical: Not on file  Tobacco Use  . Smoking status: Current Every Day  Smoker    Packs/day: 1.00    Years: 28.00    Pack years: 28.00  . Smokeless tobacco: Never Used  . Tobacco comment: uses patches and wellbutrion   Substance and Sexual Activity  . Alcohol use: No  . Drug use: No  . Sexual activity: Not Currently  Lifestyle  . Physical activity:    Days per week: Not on file    Minutes per session: Not on file  . Stress: Not on file  Relationships  . Social connections:    Talks on phone: Not on file    Gets together: Not on file    Attends religious service: Not on file    Active member of club or organization: Not on file    Attends meetings of clubs or organizations: Not on file    Relationship status: Not on file  . Intimate partner violence:    Fear of current or ex partner: Not on file    Emotionally abused: Not on file    Physically abused: Not on file    Forced sexual activity: Not on file  Other Topics Concern  . Not on file  Social History Narrative   College ed.    Administrative asst    No guns    Wears seat belt    3 kids    Pending divorce    From [place]    Current Meds   Medication Sig  . ALPRAZolam (XANAX) 0.5 MG tablet Take 1 tablet (0.5 mg total) by mouth 2 (two) times daily as needed for anxiety.  Marland Kitchen buPROPion (WELLBUTRIN XL) 150 MG 24 hr tablet Take 1 tablet (150 mg total) by mouth daily. In am, if needed increase to 300 mg in am in 2 weeks  . cetirizine (ZYRTEC) 10 MG tablet Take 10 mg by mouth daily.  Marland Kitchen DYMISTA 137-50 MCG/ACT SUSP   . ibuprofen (ADVIL,MOTRIN) 200 MG tablet Take 200 mg by mouth every 6 (six) hours as needed.  . metaxalone (SKELAXIN) 800 MG tablet Take 0.5-1 tablets (400-800 mg total) by mouth daily as needed.  Marland Kitchen omeprazole (PRILOSEC) 20 MG capsule Take 1 capsule (20 mg total) by mouth daily. 30 minutes before food  . PROAIR HFA 108 (90 Base) MCG/ACT inhaler    Allergies  Allergen Reactions  . Chantix [Varenicline] Other (See Comments)    Weird dreams, pressure headache, insomnia  . Flexeril [Cyclobenzaprine] Other (See Comments)    Makes me feel like a "zombie"  . Prednisone Other (See Comments)    Leg pain and increased blood pressure and headache   No results found for this or any previous visit (from the past 2160 hour(s)). Objective  Body mass index is 28.21 kg/m. Wt Readings from Last 3 Encounters:  11/01/18 174 lb 12 oz (79.3 kg)  07/30/18 180 lb 12.8 oz (82 kg)  04/29/18 182 lb 3.2 oz (82.6 kg)   Temp Readings from Last 3 Encounters:  11/01/18 98.6 F (37 C) (Oral)  07/30/18 98.8 F (37.1 C) (Oral)  04/29/18 98.4 F (36.9 C) (Oral)   BP Readings from Last 3 Encounters:  11/01/18 106/70  07/30/18 100/64  04/29/18 102/78   Pulse Readings from Last 3 Encounters:  11/01/18 93  07/30/18 84  04/29/18 74    Physical Exam Vitals signs and nursing note reviewed.  Constitutional:      Appearance: Normal appearance. She is well-developed, well-groomed and overweight.  HENT:     Head: Normocephalic and atraumatic.  Nose: Nose normal.     Mouth/Throat:     Mouth: Mucous membranes are moist.     Pharynx:  Oropharynx is clear.  Eyes:     Conjunctiva/sclera: Conjunctivae normal.     Pupils: Pupils are equal, round, and reactive to light.  Cardiovascular:     Rate and Rhythm: Normal rate and regular rhythm.     Heart sounds: Normal heart sounds. No murmur.  Pulmonary:     Effort: Pulmonary effort is normal.     Breath sounds: Normal breath sounds.  Abdominal:     Tenderness: There is abdominal tenderness in the right lower quadrant.  Skin:    General: Skin is warm and dry.  Neurological:     General: No focal deficit present.     Mental Status: She is alert and oriented to person, place, and time.     Gait: Gait normal.  Psychiatric:        Attention and Perception: Attention and perception normal.        Mood and Affect: Mood and affect normal.        Speech: Speech normal.        Behavior: Behavior normal. Behavior is cooperative.        Thought Content: Thought content normal.        Cognition and Memory: Cognition and memory normal.        Judgment: Judgment normal.     Assessment   1. Anxiety 2. DUB/cramping pelvic pain 3. Abdominal pain/epigastric/GERD 4. Right shoulder pain  5. HM Plan   1. Increase xanax 1 mg bid  2. Check referral ob/gyn  3. Refer to GI consider colonoscopy/EGD 4. Monitor consider Xray in future  5.  Flu shot had 07/30/18 given at visit per pt though no log Tdap had 5/29/15per [facility] notes had 2017 Td 10/15/15 left arm ? Year pnuemonia vaccinesmoker due agani  -reviewed PCP records pna 46 had 02/10/10 Consider shingrixage 53 y.o  Colonoscopy due age 11 y.o12/2019referred today consider EGD Pap1/13/17 neg pap neg [facility]amm[facility] 07/17/18  -1 pk last 3 days to 1 week max 1 ppd in the past x 2 years smoker since age 28 y.o FH mom and m aunt lung ca  Saw dermatology tbse h/o LN2 to lip appt 09/17/18   Tsh normal 11/12/17  Declines HIV testing GCT negative 10/15/15 hcv neg 10/15/15 RPR neg 10/15/15  hiv neg 10/15/15  Labs at  f/u    Saw Dr. 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 Olivia Mackie McLean-Scocuzza-Internal Medicine

## 2018-11-18 ENCOUNTER — Other Ambulatory Visit: Payer: Self-pay | Admitting: Internal Medicine

## 2018-11-18 NOTE — Telephone Encounter (Signed)
Requested medication (s) are due for refill today: not specified  Requested medication (s) are on the active medication list: yes    Last refill: 11/12/2017  Future visit scheduled yes  04/30/2019  Notes to clinic:historical provider  Requested Prescriptions  Pending Prescriptions Disp Refills   DYMISTA 137-50 MCG/ACT SUSP  1     Ear, Nose, and Throat: Nasal Preparations - Corticosteroids Passed - 11/18/2018  4:42 PM      Passed - Valid encounter within last 12 months    Recent Outpatient Visits          2 weeks ago [place] McLean-Scocuzza, Nino Glow, MD   3 months ago Anxiety and depression   [place], Nino Glow, MD   6 months ago Allergic rhinitis, unspecified seasonality, unspecified trigger   [place] McLean-Scocuzza, Nino Glow, MD      Future Appointments            In 5 months McLean-Scocuzza, Nino Glow, MD [place], [facility]

## 2018-11-18 NOTE — Telephone Encounter (Signed)
Copied from [place] [id number]. Topic: Quick Communication - Rx Refill/Question >> Nov 18, 2018  3:34 PM Bea Graff, NT wrote: Medication: DYMISTA 137-50 MCG/ACT SUSP   Has the patient contacted their pharmacy? Yes.   (Agent: If no, request that the patient contact the pharmacy for the refill.) (Agent: If yes, when and what did the pharmacy advise?)  Preferred Pharmacy (with phone number or street name): [id number] - [place], [place] [address] [phone] (Phone) [phone] (Fax)    Agent: Please be advised that RX refills may take up to 3 business days. We ask that you follow-up with your pharmacy.

## 2018-11-19 MED ORDER — DYMISTA 137-50 MCG/ACT NA SUSP: 1.0000 | Freq: Two times a day (BID) | NASAL | 12 refills | Status: AC | PRN

## 2018-12-11 ENCOUNTER — Encounter: Payer: Self-pay | Admitting: Gastroenterology

## 2018-12-11 ENCOUNTER — Ambulatory Visit (INDEPENDENT_AMBULATORY_CARE_PROVIDER_SITE_OTHER): Payer: 59 | Admitting: Gastroenterology

## 2018-12-11 ENCOUNTER — Other Ambulatory Visit: Payer: Self-pay

## 2018-12-11 VITALS — BP 123/72 | HR 70 | Ht 66.0 in | Wt 171.8 lb

## 2018-12-11 DIAGNOSIS — T39395A Adverse effect of other nonsteroidal anti-inflammatory drugs [NSAID], initial encounter: Secondary | ICD-10-CM

## 2018-12-11 DIAGNOSIS — R109 Unspecified abdominal pain: Secondary | ICD-10-CM

## 2018-12-11 DIAGNOSIS — K2960 Other gastritis without bleeding: Secondary | ICD-10-CM

## 2018-12-11 DIAGNOSIS — F17200 Nicotine dependence, unspecified, uncomplicated: Secondary | ICD-10-CM | POA: Diagnosis not present

## 2018-12-11 DIAGNOSIS — R140 Abdominal distension (gaseous): Secondary | ICD-10-CM

## 2018-12-11 NOTE — Progress Notes (Signed)
Jonathon Bellows MD, MRCP(U.K) [address]  [place]  [place],  [address]  Main: [phone]  Fax: [phone]   Gastroenterology Consultation  Referring Provider:     McLean-Scocuzza, Olivia Mackie * Primary Care Physician:  McLean-Scocuzza, Nino Glow, MD Primary Gastroenterologist:  Dr. Jonathon Bellows  Reason for Consultation:     Colon cancer screening and epigastric pain         HPI:   Erica Bailey is a 53 y.o. y/o female referred for consultation & management  by Dr. Terese Door, Nino Glow, MD.    She has been seen by her GYN for DUB. She has never had a colonoscopy . No family history of colon cancer or polyps. Denies any rectal bleeding . No change in bowel habits.   She says she has had on and off abdominal pain , on and off months back , over the past few months has got better with change of diet but still has flares. She has an episode pain , atleast every day in the form discomfort, each episode lasts a few minutes , LUQ, localized. The pain is worse with no clear factors, better when she changed her diet , not as intense , no different with a bowel movement , worse with food intake , 20 mins after, some discomfort, lasting over an hour. Mostly feels like a bloating sensation , better when she burps, better when she passes gas, sometimes foul smelling. Daily bowel movement - soft. .   No artificial sugars,diet sodas, chewing gum . She does smoke - plans to quit this week .   She takes ibuprofen 2 tablets a few times a week for hr shoulder. Was on Prilosec - took it for a month - didn't help and then stopped.    Past Medical History:  Diagnosis Date  . Allergy   . Anxiety   . Arthritis   . DDD (degenerative disc disease), cervical   . Depression   . Polycythemia   . Polycythemia secondary to smoking 07/14/2014    Past Surgical History:  Procedure Laterality Date  . ABDOMINAL EXPLORATION SURGERY    . ABLATION     2018/uterine   . APPENDECTOMY     1984 due to  stones in appendix   . TUBAL LIGATION     1991    Prior to Admission medications   Medication Sig Start Date End Date Taking? Authorizing Provider  ALPRAZolam Duanne Moron) 1 MG tablet Take 1 tablet (1 mg total) by mouth 2 (two) times daily as needed for anxiety. 11/01/18  Yes McLean-Scocuzza, Nino Glow, MD  buPROPion (WELLBUTRIN XL) 150 MG 24 hr tablet Take 1 tablet (150 mg total) by mouth daily. In am, if needed increase to 300 mg in am in 2 weeks 11/01/18  Yes McLean-Scocuzza, Nino Glow, MD  cetirizine (ZYRTEC) 10 MG tablet Take 10 mg by mouth daily.   Yes [provider]  DYMISTA 137-50 MCG/ACT SUSP Place 1 spray into the nose 2 (two) times daily as needed. 11/19/18  Yes McLean-Scocuzza, Nino Glow, MD  ibuprofen (ADVIL,MOTRIN) 200 MG tablet Take 200 mg by mouth every 6 (six) hours as needed.   Yes [provider]  PROAIR HFA 108 [phone] Base) MCG/ACT inhaler  10/10/17  Yes [provider]  pantoprazole (PROTONIX) 40 MG tablet Take 1 tablet (40 mg total) by mouth daily. 30 minutes before food Patient not taking: Reported on 12/11/2018 11/01/18   McLean-Scocuzza, Nino Glow, MD    Family History  Problem Relation Age of Onset  . Cancer Mother        lung ca  . Mental illness Mother   . Clotting disorder Father        embolism  . Heart disease Father   . Hypertension Father   . Stroke Father   . Breast cancer Sister        Breast Cancer  . Alcohol abuse Sister   . Arthritis Sister   . Asthma Sister   . Cancer Sister        ?breast   . Drug abuse Sister   . Alcohol abuse Brother   . Drug abuse Brother   . Asthma Daughter   . Cancer Maternal Aunt        lung cancer   . Cancer Maternal Grandmother        ? type   . Mental illness Maternal Grandmother   . Stroke Maternal Grandmother   . Heart disease Maternal Grandfather   . Heart disease Paternal Grandfather   . Arthritis Sister   . Cancer Sister        breast 49 doing well genetic testing neg per pt   . Cancer Maternal  Aunt        bladder and colon cancer      Social History   Tobacco Use  . Smoking status: Current Every Day Smoker    Packs/day: 1.00    Years: 28.00    Pack years: 28.00  . Smokeless tobacco: Never Used  . Tobacco comment: uses patches and wellbutrion   Substance Use Topics  . Alcohol use: No  . Drug use: No    Allergies as of 12/11/2018 - Review Complete 12/11/2018  Allergen Reaction Noted  . Chantix [varenicline] Other (See Comments) 07/14/2014  . Effexor xr [venlafaxine hcl er]  11/01/2018  . Flexeril [cyclobenzaprine] Other (See Comments) 07/14/2014  . Prednisone Other (See Comments) 07/14/2014  . Skelaxin [metaxalone]  11/01/2018    Review of Systems:    All systems reviewed and negative except where noted in HPI.   Physical Exam:  BP 123/72   Pulse 70   Ht 5\' 6"  (1.676 m)   Wt 171 lb 12.8 oz (77.9 kg)   BMI 27.73 kg/m  No LMP recorded. Patient has had an ablation. Psych:  Alert and cooperative. Normal mood and affect. General:   Alert,  Well-developed, well-nourished, pleasant and cooperative in NAD Head:  Normocephalic and atraumatic. Eyes:  Sclera clear, no icterus.   Conjunctiva pink. Ears:  Normal auditory acuity. Nose:  No deformity, discharge, or lesions. Mouth:  No deformity or lesions,oropharynx pink & moist. Neck:  Supple; no masses or thyromegaly. Lungs:  Respirations even and unlabored.  Clear throughout to auscultation.   No wheezes, crackles, or rhonchi. No acute distress. Heart:  Regular rate and rhythm; no murmurs, clicks, rubs, or gallops. Abdomen:  Normal bowel sounds.  No bruits.  Soft, non-tender and non-distended without masses, hepatosplenomegaly or hernias noted.  No guarding or rebound tenderness.      Neurologic:  Alert and oriented x3;  grossly normal neurologically. Skin:  Intact without significant lesions or rashes. No jaundice. Lymph Nodes:  No significant cervical adenopathy. Psych:  Alert and cooperative. Normal mood and  affect.  Imaging Studies: No results found.  Assessment and Plan:   Erica Bailey is a 53 y.o. y/o female has been referred for abdominal pain and colon cancer screening . I suspect she has effects of long term NSAID's +/-  aerophagia from smoking +/- SIBO  Plan  1. H pylori breath test  2. Stop smoking and using NSAID's 3. Prilosec 40 mg OTC 4. LOW FODMAP diet  5. EGD+colonoscopy for abdominal pain and colon cancer screening average risk  6. Activated charcoal tabs trial  7. If no better willl get CT abdomen   I have discussed alternative options, risks & benefits,  which include, but are not limited to, bleeding, infection, perforation,respiratory complication & drug reaction.  The patient agrees with this plan & written consent will be obtained.     Follow up in 6 weeks   Dr Jonathon Bellows MD,MRCP(U.K)

## 2018-12-11 NOTE — Addendum Note (Signed)
Addended by: Dorethea Clan on: 12/11/2018 11:04 AM   Modules accepted: Orders

## 2018-12-13 LAB — H. PYLORI BREATH TEST: H PYLORI BREATH TEST: POSITIVE — AB

## 2018-12-17 ENCOUNTER — Other Ambulatory Visit: Payer: Self-pay

## 2018-12-17 ENCOUNTER — Telehealth: Payer: Self-pay

## 2018-12-17 MED ORDER — AMOXICILLIN 500 MG PO CAPS: 1000.0000 mg | ORAL_CAPSULE | Freq: Three times a day (TID) | ORAL | 0 refills | Status: AC

## 2018-12-17 MED ORDER — CLARITHROMYCIN 500 MG PO TABS: 500.0000 mg | ORAL_TABLET | Freq: Two times a day (BID) | ORAL | 0 refills | Status: AC

## 2018-12-17 NOTE — Telephone Encounter (Signed)
Called pt to inform of lab results and Dr. Georgeann Oppenheim instructions for pt to commence on medications for treatment of H pylori.  Unable to contact, LVM to return call

## 2018-12-17 NOTE — Telephone Encounter (Signed)
Spoke with pt and informed her of lab results and Dr. Georgeann Oppenheim instructions for pt to commence on medication for treatment of H pylori. Pt also requested to reschedule her endoscopy procedure to 01-13-19. Pt prescriptions have been sent and procedure has been rescheduled.

## 2018-12-17 NOTE — Telephone Encounter (Signed)
-----   Message from Jonathon Bellows, MD sent at 12/13/2018  8:05 AM EST ----- H pylori posiitve    Suggest clarithromycin 500 mg PO BID, amoxicillin 1 gram TID, omeprazole 20 mg BID all for 14 days.  , check for penicillin allergy and will need repeat H pylori stool antigen to check for eradication after .

## 2018-12-26 ENCOUNTER — Telehealth: Payer: Self-pay | Admitting: Internal Medicine

## 2018-12-26 ENCOUNTER — Ambulatory Visit: Payer: Self-pay

## 2018-12-26 ENCOUNTER — Encounter: Payer: Self-pay | Admitting: Internal Medicine

## 2018-12-26 ENCOUNTER — Other Ambulatory Visit: Payer: Self-pay | Admitting: Internal Medicine

## 2018-12-26 ENCOUNTER — Telehealth: Payer: Self-pay | Admitting: Gastroenterology

## 2018-12-26 DIAGNOSIS — J9801 Acute bronchospasm: Secondary | ICD-10-CM

## 2018-12-26 MED ORDER — ALBUTEROL SULFATE HFA 108 (90 BASE) MCG/ACT IN AERS: 1.0000 | INHALATION_SPRAY | Freq: Four times a day (QID) | RESPIRATORY_TRACT | 12 refills | Status: AC | PRN

## 2018-12-26 MED ORDER — PROAIR HFA 108 (90 BASE) MCG/ACT IN AERS: 2.0000 | INHALATION_SPRAY | RESPIRATORY_TRACT | 3 refills | Status: DC | PRN

## 2018-12-26 NOTE — Telephone Encounter (Signed)
Patient called today & states she spoke with  Sherald Hess and was told she had a bacterial infection. She has been taking the Antibiotics(Amoxociline 500 mg 2 caps 3x's a day for 14days & Prilosec 40 mg.She is having  dry mouth with  Constipation and not feeling well.   Please advise.

## 2018-12-26 NOTE — Telephone Encounter (Signed)
Copied from [place] [id number]. Topic: Quick Communication - Rx Refill/Question >> Dec 26, 2018  1:01 PM Sheran Luz wrote: Medication: PROAIR HFA 108 (90 Base) MCG/ACT inhaler    Preferred Pharmacy (with phone number or street name): [id number] [place], [place]  [phone] (Phone) [phone] (Fax)

## 2018-12-26 NOTE — Telephone Encounter (Signed)
Pt stated that she is not having an asthma attack or SOB, wheezing or difficulty breathing. Pt c/o tightness in her chest due to her allergies. Pt stated her previous provider Dr Maurice Small ordered it last. Pt stated that the pollen is starting to flare her allergies and she and to make sure she had the Proair on hand. Please call in to [facility] in [place]. Reason for Disposition . [1] Request for URGENT new prescription or refill of "essential" medication (i.e., likelihood of harm to patient if not taken) AND [2] triager unable to fill per unit policy  Answer Assessment - Initial Assessment Questions 1. RESPIRATORY STATUS: "Describe your breathing?" (e.g., wheezing, shortness of breath, unable to speak, severe coughing No difficulty breathing 2. ONSET: "When did this asthma attack begin?"      yesterday 3. TRIGGER: "What do you think triggered this attack?" (e.g., URI, exposure to pollen or other allergen, tobacco smoke)      pollen 4. PEAK EXPIRATORY FLOW RATE (PEFR): "Do you use a peak flow meter?" If so, ask: "What's the current peak flow? What's your personal best peak flow?"      n/a 5. SEVERITY: "How bad is this attack?"    - MILD: No SOB at rest, mild SOB with walking, speaks normally in sentences, can lay down, no retractions, pulse < 100. (GREEN Zone: PEFR 80-100%)   - MODERATE: SOB at rest, SOB with minimal exertion and prefers to sit, cannot lie down flat, speaks in phrases, mild retractions, audible wheezing, pulse 100-120. (YELLOW Zone: PEFR 50-80%)    - SEVERE: Very SOB at rest, speaks in single words, struggling to breathe, sitting hunched forward, retractions, usually loud wheezing, sometimes minimal wheezing because of decreased air movement, pulse > 120. (RED Zone: PEFR < 50%).    No SOB 6. MEDICATIONS (Inhaler or nebs): "What are your asthma medications?" and "What treatments have you given so far?"    - Quick-relief: albuterol, metaproterenol, salbutamol, or other inhaled or  nebulized beta-agonist medicines   - Long-term-control: steroids, cromolyn, or other anti-inflammatory medicines.     allergies 7. OTHER SYMPTOMS: "Do you have any other symptoms? (e.g., runny nose, chest pain, fever)    Chest tightness mild, dry mouth 8. PREGNANCY: "Is there any chance you are pregnant?" "When was your last menstrual period?"     N/a  Protocols used: MEDICATION QUESTION CALL-A-AH, ASTHMA ATTACK-A-AH

## 2018-12-26 NOTE — Telephone Encounter (Signed)
Patient called and stated that her pharmacy said that the script for Proair is not covered and said that there is a generic medication that they will cover.  Patient would like the generic so that she can get it as soon as possible.  Please call patient once this has been approved.  Patient would like it expedited quickly.  CB# [phone]

## 2018-12-26 NOTE — Telephone Encounter (Signed)
Spoke with pt regarding her experiencing dry mouth and constipation. I informed her of Dr. Georgeann Oppenheim instructions for pt to only take the amoxicillin twice daily and to commence on Miralax for the constipation. Also advised pt to contact our office if symptoms get worse.

## 2018-12-27 NOTE — Telephone Encounter (Signed)
Ive sent ventolin inhaler instead of proair call the pharmacy to check   [place]

## 2019-01-01 ENCOUNTER — Other Ambulatory Visit: Payer: Self-pay

## 2019-01-01 ENCOUNTER — Telehealth: Payer: Self-pay | Admitting: Gastroenterology

## 2019-01-01 ENCOUNTER — Telehealth: Payer: Self-pay

## 2019-01-01 DIAGNOSIS — K2970 Gastritis, unspecified, without bleeding: Principal | ICD-10-CM

## 2019-01-01 DIAGNOSIS — B9681 Helicobacter pylori [H. pylori] as the cause of diseases classified elsewhere: Secondary | ICD-10-CM

## 2019-01-01 NOTE — Telephone Encounter (Signed)
Patient returned call in regards to rescheduling her colonoscopy.  She politely stated that she thought her colonoscopy was being done for the abdominal pain and infection she had.  She feels uneasy rescheduling it.  She states that she is taking medicine as advise and still experiencing pain.  I informed her that I would check with Dr. Vicente Males to see if we could schedule her for a telemed call to address her concerns and assured her that if you felt her procedure was emergernt we would not ask to reschedule it. She understood however she is still uneasy.  Her colonoscopy has not been rescheduled yet.  Please advise to adding her to telemed call.  Thanks [employer]

## 2019-01-01 NOTE — Telephone Encounter (Signed)
Spoke with pt and informed her that we have put in the order for the H pylori stool test. Pt is aware that she will need to visit her local [facility] to receive supplies to collect the specimen. Pt understands.

## 2019-01-01 NOTE — Telephone Encounter (Signed)
LVM on pts cell notifying her that her colonoscopy has been canceled due to COVID 19 and we will call back to reschedule.  Thanks [employer]

## 2019-01-01 NOTE — Telephone Encounter (Signed)
Pt is calling to check about her Lab work she needed to have done to make sure the bacteria has gone please call pt to clarify

## 2019-01-03 LAB — H. PYLORI ANTIGEN, STOOL: H pylori Ag, Stl: NEGATIVE

## 2019-01-06 ENCOUNTER — Encounter: Payer: Self-pay | Admitting: Gastroenterology

## 2019-01-06 ENCOUNTER — Telehealth: Payer: Self-pay

## 2019-01-06 NOTE — Telephone Encounter (Signed)
-----   Message from Jonathon Bellows, MD sent at 01/06/2019  8:53 AM EDT ----- Glennis Brink pylori antigen is negative after treatment- please inform

## 2019-01-06 NOTE — Telephone Encounter (Signed)
Jdijah inform : Tele visit for tomorrow- I will discuss options and risks with each and we can come to a decision together

## 2019-01-06 NOTE — Progress Notes (Signed)
Erica Bailey pylori antigen is negative after treatment- please inform

## 2019-01-06 NOTE — Telephone Encounter (Signed)
Called pt to inform her of lab results.  Unable to contact LVM to return call

## 2019-01-06 NOTE — Telephone Encounter (Signed)
Spoke with pt and informed her of lab results  

## 2019-01-06 NOTE — Telephone Encounter (Signed)
Pt is calling for [employer] for test results

## 2019-01-06 NOTE — Telephone Encounter (Signed)
Called pt and informed her that Dr. Vicente Males recommends that pt have an E-visit this week to discuss pt concerns. Pt agrees.

## 2019-01-09 ENCOUNTER — Telehealth: Payer: Self-pay

## 2019-01-09 NOTE — Telephone Encounter (Signed)
LVM  for pt to call office in regards to scheduling her telemed/virtual visit with Dr. Vicente Males for follow up.    Thanks [employer]

## 2019-01-09 NOTE — Telephone Encounter (Signed)
Patient has been scheduled for telemed visit with Dr. Vicente Males for April 3rd.  Thanks [employer]

## 2019-01-10 ENCOUNTER — Ambulatory Visit (INDEPENDENT_AMBULATORY_CARE_PROVIDER_SITE_OTHER): Payer: 59 | Admitting: Gastroenterology

## 2019-01-10 ENCOUNTER — Encounter (INDEPENDENT_AMBULATORY_CARE_PROVIDER_SITE_OTHER): Payer: 59 | Admitting: Gastroenterology

## 2019-01-10 DIAGNOSIS — R140 Abdominal distension (gaseous): Secondary | ICD-10-CM | POA: Diagnosis not present

## 2019-01-10 DIAGNOSIS — R109 Unspecified abdominal pain: Secondary | ICD-10-CM

## 2019-01-10 NOTE — Progress Notes (Signed)
Erica Bailey , MD [address]  [place]  [place], [place] [address]  Main: [phone]  Fax: [phone]   Primary Care Physician: Bailey, Erica Glow, MD  Virtual Visit via Telephone Note  I connected with patient on 01/10/19 at  9:30 AM EDT by telephone and verified that I am speaking with the correct person using two identifiers.   I discussed the limitations, risks, security and privacy concerns of performing an evaluation and management service by telephone and the availability of in person appointments. I also discussed with the patient that there may be a patient responsible charge related to this service. The patient expressed understanding and agreed to proceed.  Location of Patient: Home Location of Provider: Home Persons involved: Patient and provider only   History of Present Illness:  Follow up due to cancellation of procedures due to COVID  HPI: Erica Bailey is a 53 y.o. female initially seen on 12/11/2018 for CRCS and abdominal pain. She has been seen by her GYN for DUB. She has never had a colonoscopy . No family history of colon cancer or polyps. Denies any rectal bleeding . No change in bowel habits. She says she has had on and off abdominal pain , on and off for  months  , over the past few months has got better with change of diet but still has flares. She has an episode pain , atleast every day in the form discomfort, each episode lasts a few minutes , LUQ, localized. The pain is worse with no clear factors, better when she changed her diet , not as intense , no different with a bowel movement , worse with food intake , 20 mins after, some discomfort, lasting over an hour. Mostly feels like a bloating sensation , better when she burps, better when she passes gas, sometimes foul smelling. Daily bowel movement - soft. No artificial sugars,diet sodas, chewing gum . She does smoke - plans to quit this week .   She takes ibuprofen 2 tablets a few times a  week for hr shoulder. Was on Prilosec - took it for a month - didn't help and then stopped.   Interval history  12/11/2018-  01/10/2019  01/02/2019 : H pylori stool test - negative .  She is feeling better, stopped all her NSAID's,wanted to know if Tyelonol is safe which I said was apporopriate.   Current Outpatient Medications  Medication Sig Dispense Refill  . albuterol (PROVENTIL HFA;VENTOLIN HFA) 108 (90 Base) MCG/ACT inhaler Inhale 1-2 puffs into the lungs every 6 (six) hours as needed for wheezing or shortness of breath. 1 Inhaler 12  . ALPRAZolam (XANAX) 1 MG tablet Take 1 tablet (1 mg total) by mouth 2 (two) times daily as needed for anxiety. 60 tablet 2  . buPROPion (WELLBUTRIN XL) 150 MG 24 hr tablet Take 1 tablet (150 mg total) by mouth daily. In am, if needed increase to 300 mg in am in 2 weeks 60 tablet 2  . cetirizine (ZYRTEC) 10 MG tablet Take 10 mg by mouth daily.    Marland Kitchen DYMISTA 137-50 MCG/ACT SUSP Place 1 spray into the nose 2 (two) times daily as needed. 1 Bottle 12  . ibuprofen (ADVIL,MOTRIN) 200 MG tablet Take 200 mg by mouth every 6 (six) hours as needed.    . pantoprazole (PROTONIX) 40 MG tablet Take 1 tablet (40 mg total) by mouth daily. 30 minutes before food (Patient not taking: Reported on 12/11/2018) 90 tablet 3  No current facility-administered medications for this visit.     Allergies as of 01/10/2019 - Review Complete 01/09/2019  Allergen Reaction Noted  . Chantix [varenicline] Other (See Comments) 07/14/2014  . Effexor xr [venlafaxine hcl er]  11/01/2018  . Flexeril [cyclobenzaprine] Other (See Comments) 07/14/2014  . Prednisone Other (See Comments) 07/14/2014  . Skelaxin [metaxalone]  11/01/2018    Review of Systems:    All systems reviewed and negative except where noted in HPI.   Observations/Objective:  Labs: CMP  No results found for: NA, K, CL, CO2, GLUCOSE, BUN, CREATININE, CALCIUM, PROT, ALBUMIN, AST, ALT, ALKPHOS, BILITOT, GFRNONAA, GFRAA Lab  Results  Component Value Date   WBC 5.8 03/29/2018   HGB 15.8 03/29/2018   HCT 46.1 03/29/2018   MCV 93.5 03/29/2018   PLT 211 03/29/2018    Imaging Studies: No results found.  Assessment and Plan:   Erica Bailey is a 53 y.o. y/o female here to follow up  for abdominal pain and colon cancer screening . I suspect she has effects of long term NSAID's +/- aerophagia from smoking +/- SIBO. She was scheduled for an EGD+colonoscopy and due to the COVID-19 has been postponed as all non urgent procedures have due to lack of resources and in addition to maintain social distancing   Plan  1.  Stop smoking and using NSAID's 3. Prilosec 40 mg OTC to continue  4. LOW FODMAP diet  5. colonoscopy for colon cancer screening average risk once COVID pandemic has resolved.  6. If no better willl get CT abdomen   Follow Up Instructions:   F/u in 3 months video visit   I discussed the assessment and treatment plan with the patient. The patient was provided an opportunity to ask questions and all were answered. The patient agreed with the plan and demonstrated an understanding of the instructions.   The patient was advised to call back or seek an in-person evaluation if the symptoms worsen or if the condition fails to improve as anticipated.  I provided 15 minutes of non-face-to-face time during this encounter.  Dr Erica Bellows MD,MRCP [facility]) Gastroenterology/Hepatology Pager: [phone]   Speech recognition software was used to dictate this note.

## 2019-01-13 ENCOUNTER — Ambulatory Visit: Admission: RE | Admit: 2019-01-13 | Payer: 59 | Source: Home / Self Care | Admitting: Gastroenterology

## 2019-01-13 ENCOUNTER — Encounter: Admission: RE | Payer: Self-pay | Source: Home / Self Care

## 2019-01-13 SURGERY — COLONOSCOPY WITH PROPOFOL
Anesthesia: General

## 2019-01-20 IMAGING — MG MM DIGITAL DIAGNOSTIC BILAT W/ TOMO W/ CAD
6 of 10 series · 6 of 30 positions shown · non-contrast
Comparison: Previous exam(s).

CLINICAL DATA: 51-year-old female with intermittent pain in the
bilateral breasts. This is slightly more focal in the superior right
breast.

EXAM:
DIGITAL DIAGNOSTIC BILATERAL MAMMOGRAM WITH CAD AND TOMO
ULTRASOUND RIGHT BREAST

[R TAN synth-2D]
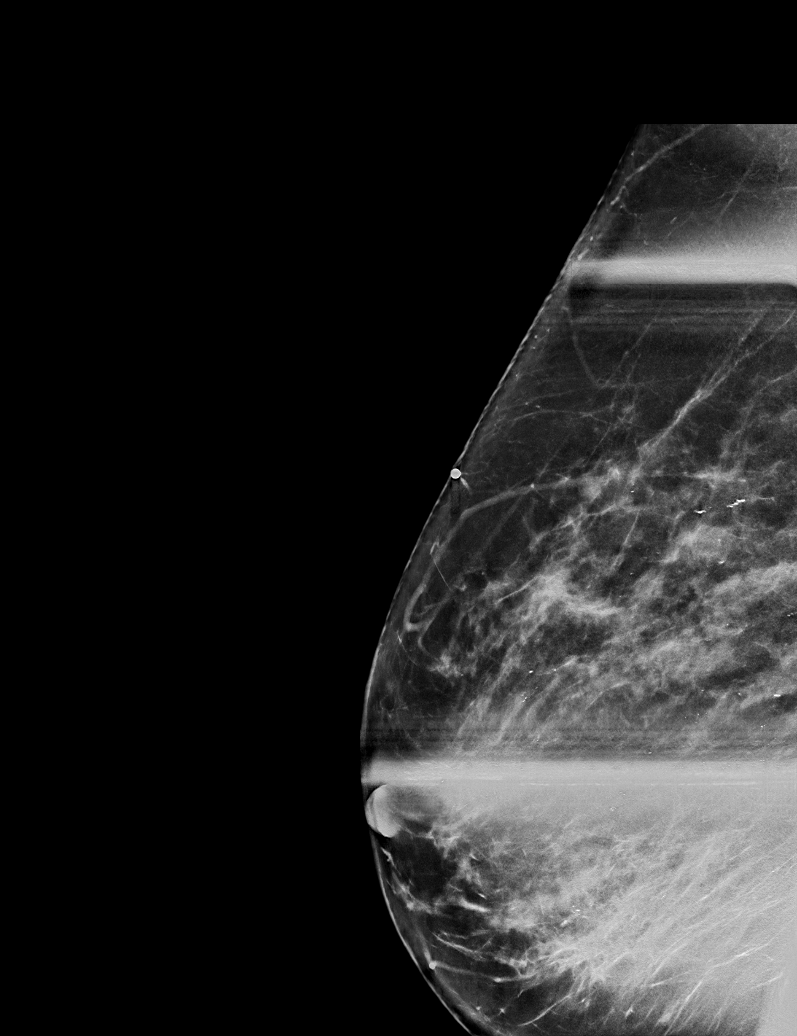

[L CC synth-2D]
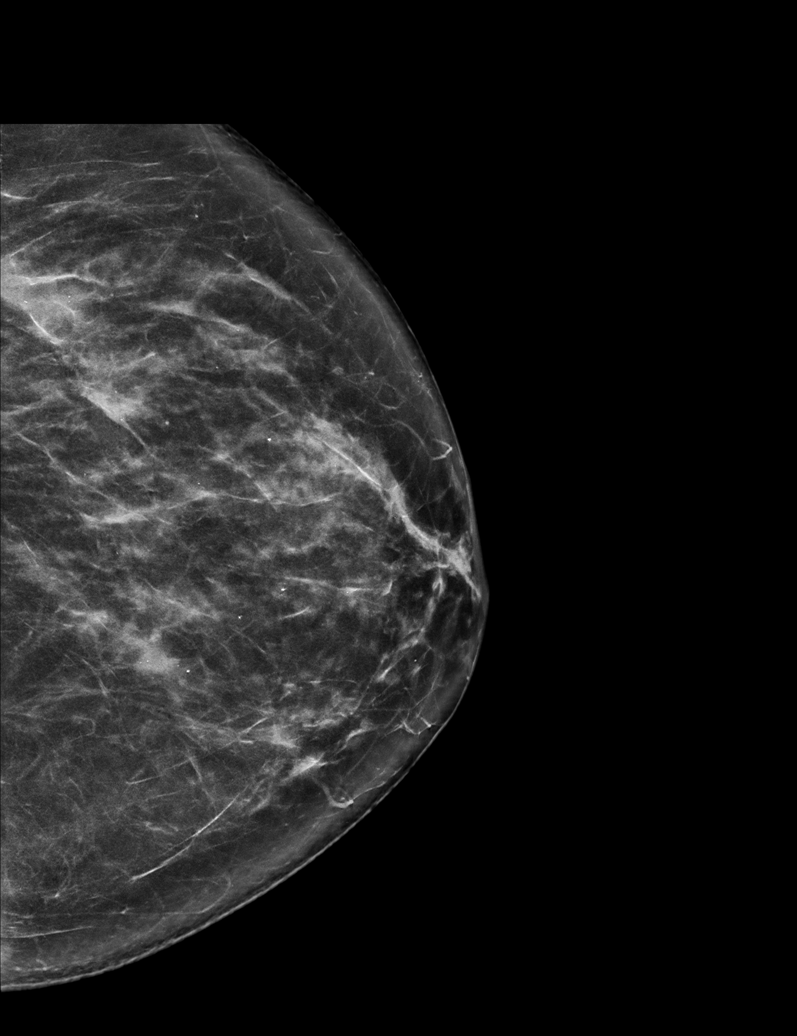

[R MLO synth-2D]
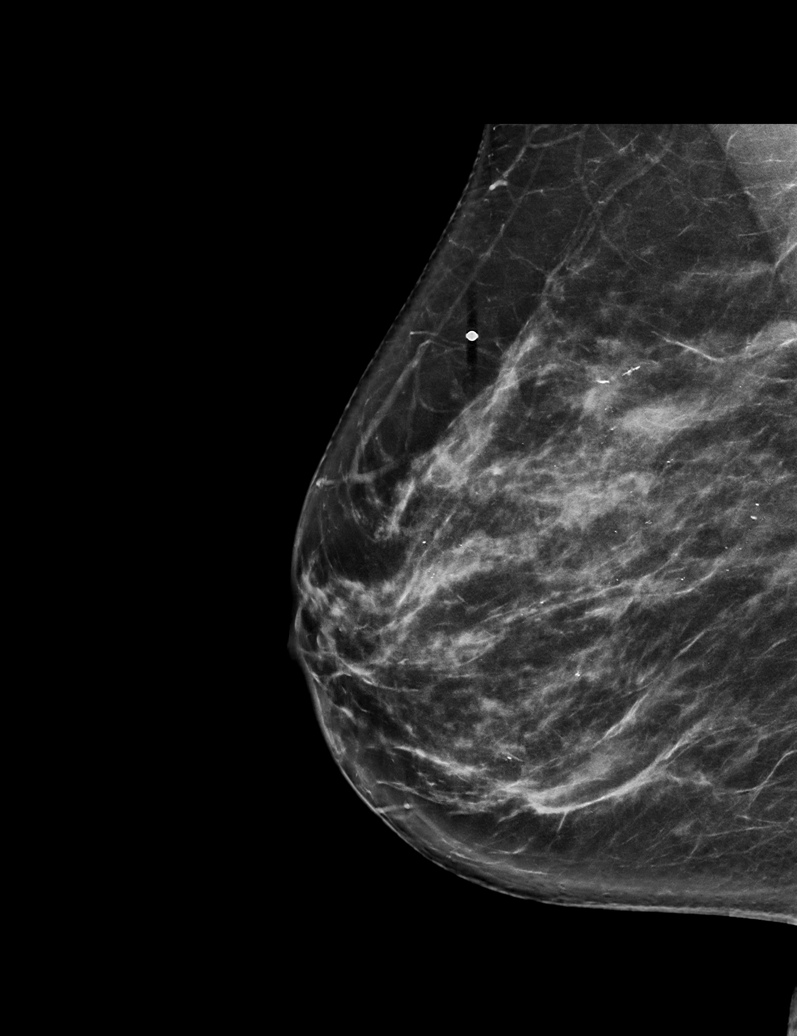

[R CC synth-2D]
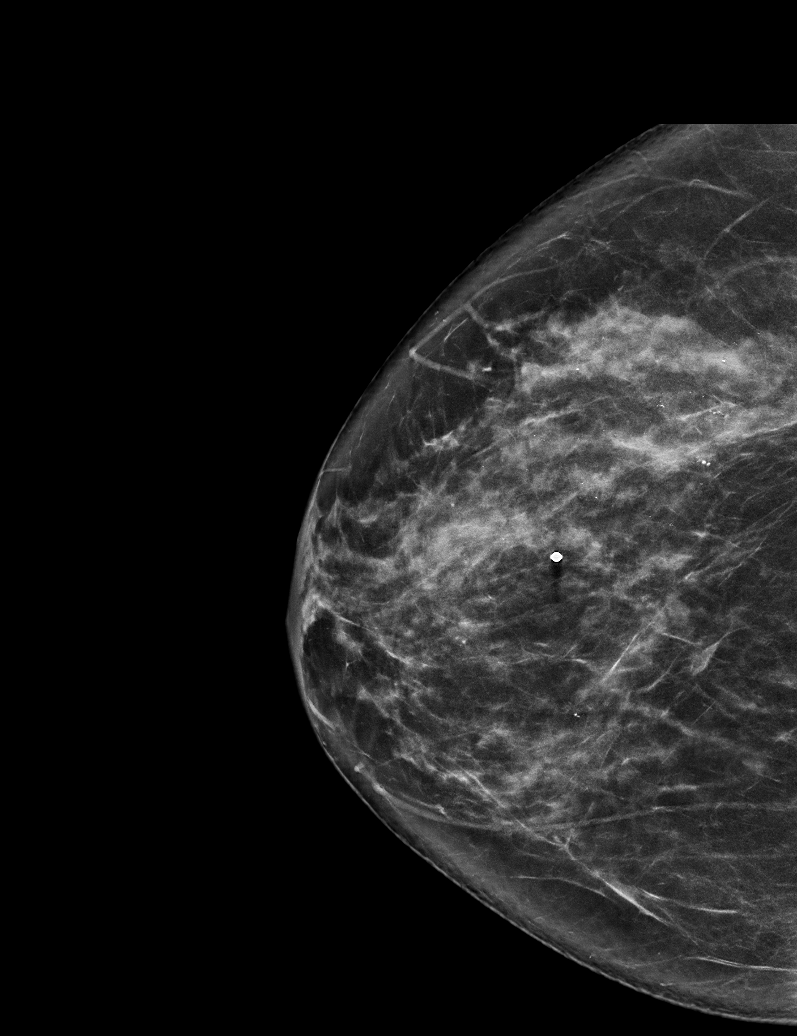

[L MLO synth-2D]
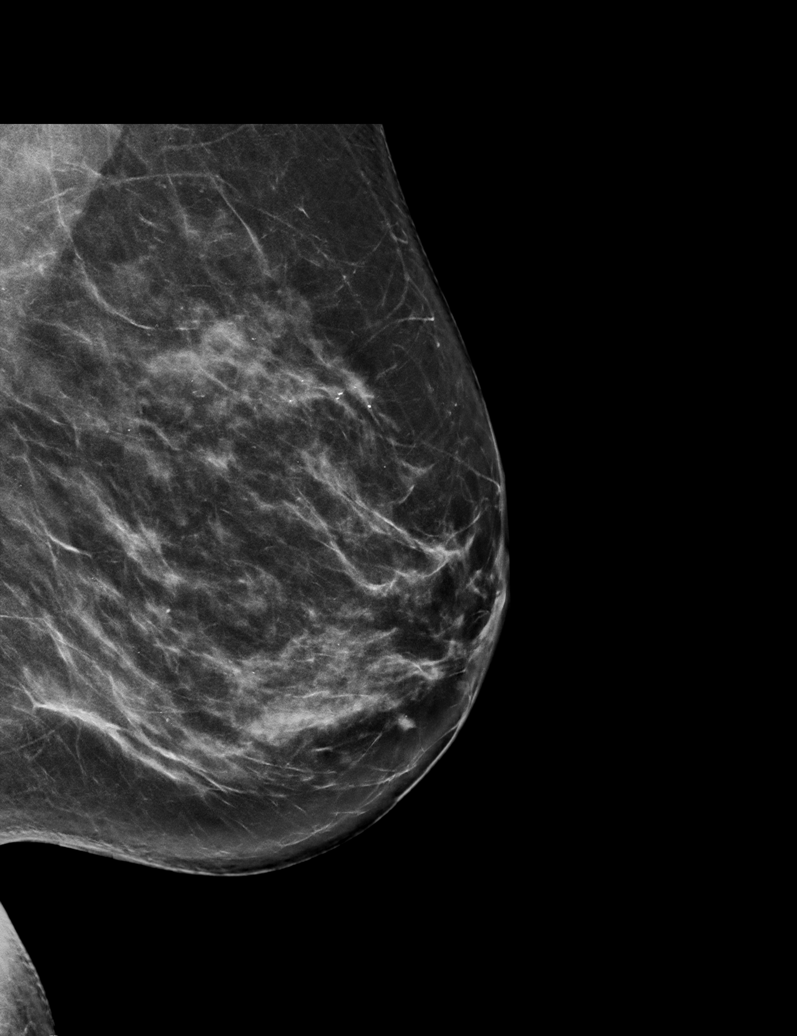

[L CC tomo · tomo slice 37/74.0]
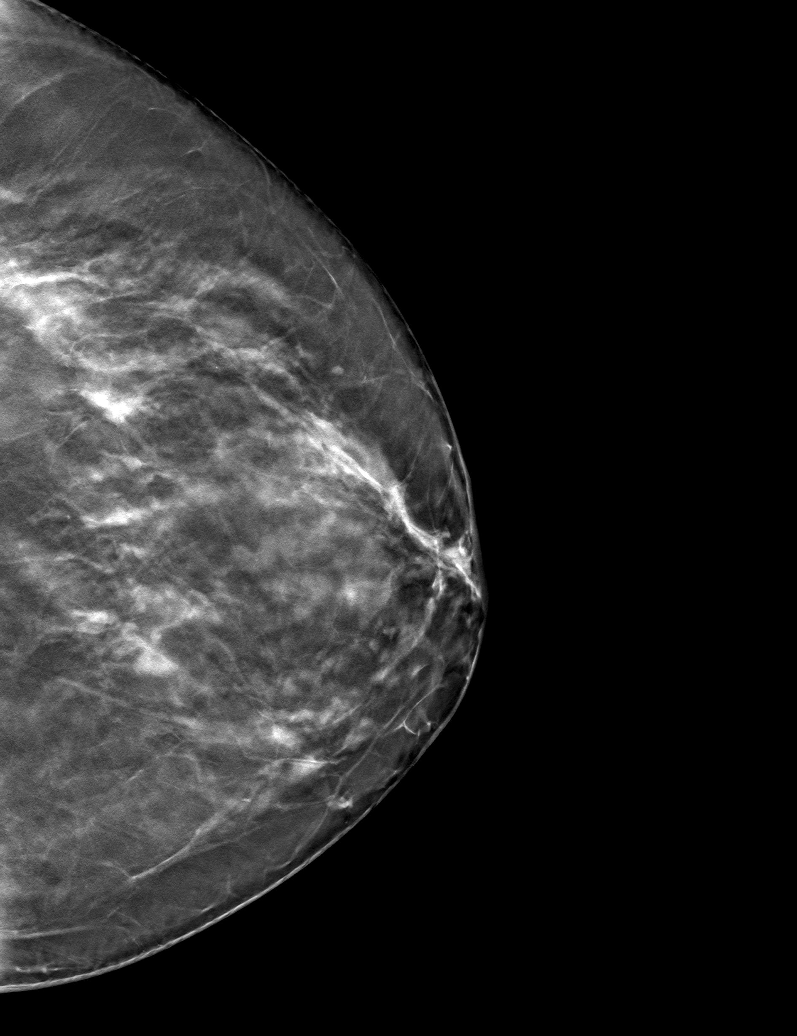

[6 of 30 positions shown; findings below may reference images not displayed]

ACR Breast Density Category c: The breast tissue is heterogeneously
dense, which may obscure small masses.
FINDINGS: Radiopaque BB was placed at the site of the patient's focal symptoms
in the superior central right breast at middle depth. No focal or
suspicious mammographic findings are seen deep to the radiopaque BB
or within the remainder of either breast.

Mammographic images were processed with CAD.

Targeted ultrasound is performed, showing normal fibroglandular
tissue without focal or suspicious sonographic abnormality.
Evaluation of the entire superior right breast was performed.
IMPRESSION: 1. No mammographic evidence of malignancy in either breast.
2. No additional suspicious sonographic findings at the site of the
patient's right breast pain.

RECOMMENDATION:
1. Clinical follow-up recommended for the painful area of concern in
the right breast. Any further workup should be based on clinical
grounds.
2.  Screening mammogram in one year.(Code:PD-A-F17)

I have discussed the findings and recommendations with the patient.
Results were also provided in writing at the conclusion of the
visit. If applicable, a reminder letter will be sent to the patient
regarding the next appointment.

BI-RADS CATEGORY  1: Negative.

## 2019-01-21 ENCOUNTER — Other Ambulatory Visit: Payer: Self-pay | Admitting: Internal Medicine

## 2019-01-21 DIAGNOSIS — F419 Anxiety disorder, unspecified: Principal | ICD-10-CM

## 2019-01-21 DIAGNOSIS — F32A Depression, unspecified: Secondary | ICD-10-CM

## 2019-01-21 DIAGNOSIS — F329 Major depressive disorder, single episode, unspecified: Secondary | ICD-10-CM

## 2019-01-21 MED ORDER — BUPROPION HCL ER (XL) 150 MG PO TB24: 150.0000 mg | ORAL_TABLET | Freq: Every day | ORAL | 3 refills | Status: DC

## 2019-01-22 ENCOUNTER — Ambulatory Visit: Payer: 59 | Admitting: Gastroenterology

## 2019-01-24 ENCOUNTER — Other Ambulatory Visit: Payer: Self-pay | Admitting: Internal Medicine

## 2019-01-24 DIAGNOSIS — F419 Anxiety disorder, unspecified: Secondary | ICD-10-CM

## 2019-01-24 MED ORDER — ALPRAZOLAM 1 MG PO TABS: 1.0000 mg | ORAL_TABLET | Freq: Two times a day (BID) | ORAL | 2 refills | Status: DC | PRN

## 2019-03-26 ENCOUNTER — Inpatient Hospital Stay: Payer: Medicaid Other

## 2019-03-27 ENCOUNTER — Inpatient Hospital Stay: Payer: Medicaid Other | Admitting: Oncology

## 2019-03-28 ENCOUNTER — Ambulatory Visit: Payer: BLUE CROSS/BLUE SHIELD | Admitting: Oncology

## 2019-03-28 ENCOUNTER — Other Ambulatory Visit: Payer: BLUE CROSS/BLUE SHIELD

## 2019-04-03 ENCOUNTER — Other Ambulatory Visit: Payer: Self-pay

## 2019-04-04 ENCOUNTER — Inpatient Hospital Stay: Payer: Medicaid Other | Admitting: Oncology

## 2019-04-04 ENCOUNTER — Inpatient Hospital Stay: Payer: Medicaid Other

## 2019-04-15 ENCOUNTER — Ambulatory Visit: Payer: 59 | Admitting: Gastroenterology

## 2019-04-23 ENCOUNTER — Telehealth: Payer: Self-pay | Admitting: Internal Medicine

## 2019-04-23 ENCOUNTER — Other Ambulatory Visit: Payer: Self-pay | Admitting: Internal Medicine

## 2019-04-23 DIAGNOSIS — F419 Anxiety disorder, unspecified: Secondary | ICD-10-CM

## 2019-04-23 DIAGNOSIS — F329 Major depressive disorder, single episode, unspecified: Secondary | ICD-10-CM

## 2019-04-23 MED ORDER — ALPRAZOLAM 1 MG PO TABS: 1.0000 mg | ORAL_TABLET | Freq: Two times a day (BID) | ORAL | 0 refills | Status: DC | PRN

## 2019-04-23 MED ORDER — BUPROPION HCL ER (XL) 150 MG PO TB24: 150.0000 mg | ORAL_TABLET | Freq: Every day | ORAL | 3 refills | Status: DC

## 2019-04-23 NOTE — Telephone Encounter (Signed)
[facility] [place].  Pt also requested refill on ALPRAZolam Duanne Moron) 1 MG tablet [id number] [place], [place], [place] (Phone) [phone] (Fax)   Pt said that she will be in [place] for another 2 weeks and will run out of Xanax Rx before she returns to [place].    Last OV:  11/01/18  Last Refill:  01/24/19

## 2019-04-23 NOTE — Telephone Encounter (Unsigned)
Copied from [place] [id number]. Topic: Quick Communication - Rx Refill/Question >> Apr 23, 2019  1:18 PM Mcneil, Ja-Kwan wrote: Medication: buPROPion (WELLBUTRIN XL) 150 MG 24 hr tablet   Has the patient contacted their pharmacy? yes   Preferred Pharmacy (with phone number or street name): [id number] [place] [place] - [address] [phone] (Phone) [phone] (Fax)  Agent: Please be advised that RX refills may take up to 3 business days. We ask that you follow-up with your pharmacy.

## 2019-04-30 ENCOUNTER — Other Ambulatory Visit: Payer: Self-pay

## 2019-04-30 ENCOUNTER — Encounter: Payer: Self-pay | Admitting: Internal Medicine

## 2019-04-30 ENCOUNTER — Ambulatory Visit (INDEPENDENT_AMBULATORY_CARE_PROVIDER_SITE_OTHER): Payer: 59 | Admitting: Internal Medicine

## 2019-04-30 DIAGNOSIS — S0990XA Unspecified injury of head, initial encounter: Secondary | ICD-10-CM

## 2019-04-30 DIAGNOSIS — A048 Other specified bacterial intestinal infections: Secondary | ICD-10-CM

## 2019-04-30 DIAGNOSIS — R51 Headache: Secondary | ICD-10-CM

## 2019-04-30 DIAGNOSIS — F329 Major depressive disorder, single episode, unspecified: Secondary | ICD-10-CM

## 2019-04-30 DIAGNOSIS — F4323 Adjustment disorder with mixed anxiety and depressed mood: Secondary | ICD-10-CM

## 2019-04-30 DIAGNOSIS — M542 Cervicalgia: Secondary | ICD-10-CM

## 2019-04-30 DIAGNOSIS — R519 Headache, unspecified: Secondary | ICD-10-CM

## 2019-04-30 DIAGNOSIS — G4700 Insomnia, unspecified: Secondary | ICD-10-CM | POA: Insufficient documentation

## 2019-04-30 DIAGNOSIS — J309 Allergic rhinitis, unspecified: Secondary | ICD-10-CM

## 2019-04-30 DIAGNOSIS — F419 Anxiety disorder, unspecified: Secondary | ICD-10-CM

## 2019-04-30 NOTE — Progress Notes (Signed)
Virtual Visit via Video Note  I connected with Erica Bailey  on 04/30/19 at  1:30 PM EDT by a video enabled telemedicine application and verified that I am speaking with the correct person using two identifiers.  Location patient: home Location provider:work  Persons participating in the virtual visit: patient, provider  I discussed the limitations of evaluation and management by telemedicine and the availability of in person appointments. The patient expressed understanding and agreed to proceed.   HPI: 1. Anxiety/insomnia uncontrolled after house [place] [address] and divorce. She is having increased panic and irritability on xanax 1 mg bid prn and wellbutrin xl 150 mg qd she thinks she needs to see someone but is currently in [place] w/o insurance. She will likely be back from [place] in 2 weeks and try to have insurance by 08/2019  2. 04/26/19 she hit her head on a bar and neck went backwards. She did not get checked out but had had h/a intermittently daily since and right neck pain. It is like a dull pain and already has arthritis/DDD changes in her neck. H/a ranges 5-8/10 nothing tried. She wants to know if she should go get checked out as sister rec.  3. H pylori abdominal pain is better since tx'ed H pylori she still needs to f/u GI for EGD and colonoscopy in future when gets insurance again Dr. Josue Hector  4. Allergies worse with eyes watering and ears popping and takes prn Zyrtec singlair did not help in the past    ROS: See pertinent positives and negatives per HPI.  Past Medical History:  Diagnosis Date  . Allergy   . Anxiety   . Arthritis   . DDD (degenerative disc disease), cervical   . Depression   . Polycythemia   . Polycythemia secondary to smoking 07/14/2014    Past Surgical History:  Procedure Laterality Date  . ABDOMINAL EXPLORATION SURGERY    . ABLATION     2018/uterine   . APPENDECTOMY     1984 due to stones in appendix   . TUBAL LIGATION     1991    Family  History  Problem Relation Age of Onset  . Cancer Mother        lung ca  . Mental illness Mother   . Clotting disorder Father        embolism  . Heart disease Father   . Hypertension Father   . Stroke Father   . Breast cancer Sister        Breast Cancer  . Alcohol abuse Sister   . Arthritis Sister   . Asthma Sister   . Cancer Sister        ?breast   . Drug abuse Sister   . Alcohol abuse Brother   . Drug abuse Brother   . Asthma Daughter   . Cancer Maternal Aunt        lung cancer   . Cancer Maternal Grandmother        ? type   . Mental illness Maternal Grandmother   . Stroke Maternal Grandmother   . Heart disease Maternal Grandfather   . Heart disease Paternal Grandfather   . Arthritis Sister   . Cancer Sister        breast 38 doing well genetic testing neg per pt   . 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  albuterol (PROVENTIL HFA;VENTOLIN HFA) 108 (90 Base) MCG/ACT inhaler, Inhale 1-2 puffs into the lungs every 6 (six) hours as needed for wheezing or shortness of breath., Disp: 1 Inhaler, Rfl: 12 .  ALPRAZolam (XANAX) 1 MG tablet, Take 1 tablet (1 mg total) by mouth 2 (two) times daily as needed for anxiety., Disp: 60 tablet, Rfl: 0 .  buPROPion (WELLBUTRIN XL) 150 MG 24 hr tablet, Take 1 tablet (150 mg total) by mouth daily., Disp: 90 tablet, Rfl: 3 .  cetirizine (ZYRTEC) 10 MG tablet, Take 10 mg by mouth daily., Disp: , Rfl:  .  DYMISTA 137-50 MCG/ACT SUSP, Place 1 spray into the nose 2 (two) times daily as needed., Disp: 1 Bottle, Rfl: 12 .  ibuprofen (ADVIL,MOTRIN) 200 MG tablet, Take 200 mg by mouth every 6 (six) hours as needed., Disp: , Rfl:  .  pantoprazole (PROTONIX) 40 MG tablet, Take 1 tablet (40 mg total) by mouth daily. 30 minutes before food, Disp: 90 tablet, Rfl: 3  EXAM:  VITALS per  patient if applicable:  GENERAL: alert, oriented, appears well and in no acute distress  HEENT: atraumatic, conjunttiva clear, no obvious abnormalities on inspection of external nose and ears  NECK: normal movements of the head and neck  LUNGS: on inspection no signs of respiratory distress, breathing rate appears normal, no obvious gross SOB, gasping or wheezing  CV: no obvious cyanosis  MS: moves all visible extremities without noticeable abnormality  PSYCH/NEURO: pleasant and cooperative, no obvious depression or anxiety, speech and thought processing grossly intact  ASSESSMENT AND PLAN:  Discussed the following assessment and plan:  Anxiety and depression and insomnia- Plan:  Adjustment reaction with anxiety and depression - Plan: -cont meds rec f/u with therapy and psych  Disc L theanine to try and exercise   Cervicalgia with recent head injury with h/a- Plan: with recent head trauma and h/a rec go to [facility] in [place] to get checked for CT head and CT cervical spine pt will consider  rec prn Tylenol   H. pylori infection - Plan: needs EGD with Dr. Jonathon Bellows in future   Allergic rhinitis, unspecified seasonality, unspecified trigger - Plan: disc Xyzal prn otc singulair did not help in the past   HM Flu shot had 07/30/18 given at visit per pt though no log Tdap had 5/29/15per [facility] notes had 2017 Td 10/15/15 left arm ? Year pnuemonia vaccinesmoker due again -reviewed PCP records pna 80 had 02/10/10 Consider shingrixage 53 y.o  Colonoscopy due age 44 y.oand consider EGD with Dr. Jonathon Bellows pt currently w/o insurance  [facility] Pt established with Dr. Leonides Schanz will need pap in future when gets insurance  Mammogramneg 07/17/18 order in for future  -1 pk last 3 days to 1 week max 1 ppd in the past x 2 years smoker since age 69 y.o FH mom and m aunt lung ca  Saw dermatology tbse h/o LN2 to lipappt 12/10/19will need f/u in future   Will need fating  labs in future when gets health insurance as of 04/30/19 w/o health insurance   Tsh normal 11/12/17  Declines HIV testing GCT negative 10/15/15 hcv neg 10/15/15 RPR neg 10/15/15  hiv neg 10/15/15   Saw Dr. 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 The patient was provided an opportunity to ask questions and all were answered. The patient agreed with the plan and demonstrated an understanding of the instructions.   The patient was advised to call back or seek an in-person evaluation if the symptoms worsen or if the condition fails to improve as anticipated.  Time spent 15 minutes  Delorise Jackson, MD

## 2019-05-01 ENCOUNTER — Telehealth: Payer: Self-pay | Admitting: Internal Medicine

## 2019-05-01 NOTE — Telephone Encounter (Signed)
I called pt and left vm for pt to call ofc to schedule 4-6 months when she has insurance

## 2019-05-28 ENCOUNTER — Other Ambulatory Visit: Payer: Self-pay | Admitting: Internal Medicine

## 2019-05-28 DIAGNOSIS — F419 Anxiety disorder, unspecified: Secondary | ICD-10-CM

## 2019-05-28 MED ORDER — ALPRAZOLAM 1 MG PO TABS: 1.0000 mg | ORAL_TABLET | Freq: Two times a day (BID) | ORAL | 0 refills | Status: DC | PRN

## 2019-07-02 ENCOUNTER — Other Ambulatory Visit: Payer: Self-pay | Admitting: Internal Medicine

## 2019-07-02 DIAGNOSIS — F419 Anxiety disorder, unspecified: Secondary | ICD-10-CM

## 2019-07-02 MED ORDER — ALPRAZOLAM 1 MG PO TABS: 1.0000 mg | ORAL_TABLET | Freq: Two times a day (BID) | ORAL | 0 refills | Status: DC | PRN

## 2019-07-07 ENCOUNTER — Telehealth: Payer: Self-pay | Admitting: Internal Medicine

## 2019-07-07 ENCOUNTER — Other Ambulatory Visit: Payer: Self-pay | Admitting: Internal Medicine

## 2019-07-07 DIAGNOSIS — F329 Major depressive disorder, single episode, unspecified: Secondary | ICD-10-CM

## 2019-07-07 DIAGNOSIS — F419 Anxiety disorder, unspecified: Secondary | ICD-10-CM

## 2019-07-07 MED ORDER — BUPROPION HCL ER (XL) 150 MG PO TB24: 150.0000 mg | ORAL_TABLET | Freq: Every day | ORAL | 3 refills | Status: AC

## 2019-07-07 NOTE — Telephone Encounter (Signed)
Brock call pts pharmacy in [place] and advise pt of below    She can try Allegra over the counter and take at night instead of xyzal or zyrtec The only other choices over the counter for allergies flonase nose spray, or claritin which is similar to above   Xanax was just filled in [place] 07/02/19  -did she pick this up ? -brock call the pharmacy there -if not we can fill if so, the refill is too early   When is she coming back to [place]? Ask pt If she will be living out of state she will need to find a provider there   I can comment on CBD oil but if she is vaping I dont recommend this  We have Wellbutrin, chantix, nicotine patches and gum which we recommend to stop as well as self will/determination

## 2019-07-07 NOTE — Telephone Encounter (Signed)
Xyzal is making patient very sleepy she would like to change, zyrtec is not effective anymore. [facility] [place] [phone], [address] [place], [place] [address]. She cannot get it transferred to different state. Also she would like to know how PCP thinks about CBD oil to quit smoking.

## 2019-07-07 NOTE — Telephone Encounter (Signed)
Patient is calling regarding her buPROPion (WELLBUTRIN XL) 150 MG 24 hr tablet [id number] & ALPRAZolam (XANAX) 1 MG tablet [id number] and increased allergies. Please advise CB- [phone]

## 2019-07-08 NOTE — Telephone Encounter (Signed)
Patient did not pick up medication. Pt stated she will stay on zyrtec for now. She will be back in  on OCT2017. She is not working.

## 2019-07-09 ENCOUNTER — Other Ambulatory Visit: Payer: Self-pay | Admitting: Internal Medicine

## 2019-07-09 DIAGNOSIS — F419 Anxiety disorder, unspecified: Secondary | ICD-10-CM

## 2019-07-09 MED ORDER — ALPRAZOLAM 1 MG PO TABS: 1.0000 mg | ORAL_TABLET | Freq: Two times a day (BID) | ORAL | 0 refills | Status: DC | PRN

## 2019-07-09 NOTE — Telephone Encounter (Signed)
Sent to [place]  She will need appt when comes back to [place], schedule

## 2019-07-09 NOTE — Telephone Encounter (Signed)
Patient called to check status of Rx requested for the ALPRAZolam Duanne Moron) 1 MG tablet States that she is completely out and need it ASAP.  Patient also asking for Fransisco Beau to call her back at Ph# [phone]

## 2019-08-08 ENCOUNTER — Other Ambulatory Visit: Payer: Self-pay | Admitting: Internal Medicine

## 2019-08-08 DIAGNOSIS — F419 Anxiety disorder, unspecified: Secondary | ICD-10-CM

## 2019-08-08 NOTE — Telephone Encounter (Signed)
Requested medication (s) are due for refill today: yes  Requested medication (s) are on the active medication list: yes  Last refill:  07/09/2019  Future visit scheduled: yes  Notes to clinic:  Refill cannot be delegated   Requested Prescriptions  Pending Prescriptions Disp Refills   ALPRAZolam (XANAX) 1 MG tablet 60 tablet 0    Sig: Take 1 tablet (1 mg total) by mouth 2 (two) times daily as needed for anxiety.     Not Delegated - Psychiatry:  Anxiolytics/Hypnotics Failed - 08/08/2019  2:12 PM      Failed - This refill cannot be delegated      Failed - Urine Drug Screen completed in last 360 days.      Passed - Valid encounter within last 6 months    Recent Outpatient Visits          3 months ago Anxiety and depression   [place] McLean-Scocuzza, Nino Glow, MD   9 months ago [place] McLean-Scocuzza, Nino Glow, MD   1 year ago Anxiety and depression   [facility] McLean-Scocuzza, Nino Glow, MD   1 year ago Allergic rhinitis, unspecified seasonality, unspecified trigger   [place] McLean-Scocuzza, Nino Glow, MD      Future Appointments            In 1 month McLean-Scocuzza, Nino Glow, MD [facility], [facility]

## 2019-08-08 NOTE — Telephone Encounter (Signed)
Medication: ALPRAZolam (XANAX) 1 MG tablet [id number]   Has the patient contacted their pharmacy? Yes  (Agent: If no, request that the patient contact the pharmacy for the refill.) (Agent: If yes, when and what did the pharmacy advise?)  Preferred Pharmacy (with phone number or street name[id number] [place], [place] [address] [phone] (Phone) [phone] (Fax)    Agent: Please be advised that RX refills may take up to 3 business days. We ask that you follow-up with your pharmacy.

## 2019-08-12 MED ORDER — ALPRAZOLAM 1 MG PO TABS: 1.0000 mg | ORAL_TABLET | Freq: Two times a day (BID) | ORAL | 0 refills | Status: DC | PRN

## 2019-09-15 ENCOUNTER — Other Ambulatory Visit: Payer: Self-pay | Admitting: Internal Medicine

## 2019-09-15 DIAGNOSIS — F419 Anxiety disorder, unspecified: Secondary | ICD-10-CM

## 2019-09-15 MED ORDER — ALPRAZOLAM 1 MG PO TABS: 1.0000 mg | ORAL_TABLET | Freq: Two times a day (BID) | ORAL | 2 refills | Status: AC | PRN

## 2019-09-23 ENCOUNTER — Telehealth: Payer: Self-pay | Admitting: Internal Medicine

## 2019-09-23 NOTE — Telephone Encounter (Signed)
I called pt twice and left a vm to call ofc. °

## 2019-09-24 ENCOUNTER — Other Ambulatory Visit: Payer: Self-pay

## 2019-09-24 ENCOUNTER — Ambulatory Visit (INDEPENDENT_AMBULATORY_CARE_PROVIDER_SITE_OTHER): Payer: Medicaid Other | Admitting: Internal Medicine

## 2019-09-24 VITALS — Ht 66.0 in | Wt 168.0 lb

## 2019-09-24 DIAGNOSIS — F32A Depression, unspecified: Secondary | ICD-10-CM

## 2019-09-24 DIAGNOSIS — J069 Acute upper respiratory infection, unspecified: Secondary | ICD-10-CM

## 2019-09-24 DIAGNOSIS — F419 Anxiety disorder, unspecified: Secondary | ICD-10-CM

## 2019-09-24 DIAGNOSIS — F329 Major depressive disorder, single episode, unspecified: Secondary | ICD-10-CM

## 2019-09-24 NOTE — Progress Notes (Signed)
Virtual Visit via Video Note  I connected with Erica Bailey  on 09/24/19 at  1:40 PM EST by a video enabled telemedicine application and verified that I am speaking with the correct person using two identifiers.  Location patient: home Location provider:work or home office Persons participating in the virtual visit: patient, provider  I discussed the limitations of evaluation and management by telemedicine and the availability of in person appointments. The patient expressed understanding and agreed to proceed.   HPI: 1. C/o URI going for covid rapid test today at 6 pm 12/12 had sore throat, cough clear phelgm chest congestion upper and sore throat x 2 days resolved, body aches down to feet which have resolved, no fever, no sob doing better today, tried warm salt gargles and apple cider vinegar and water. Voice still hoard and drinking a lot of water and orange juice and hebal tea 2. Anxiety/depression stable currently living with son in [place] in [place] though back and forth and near grandson 45 min in [place]. She is undecided based on job if she will stay in [place] or [place] due to ex husband no paying alimony  3. GI issues better since stopped dairy she is taking womens 1 a day vitamin  ROS: See pertinent positives and negatives per HPI.  Past Medical History:  Diagnosis Date  . Allergy   . Anxiety   . Arthritis   . DDD (degenerative disc disease), cervical   . Depression   . Polycythemia   . Polycythemia secondary to smoking 07/14/2014    Past Surgical History:  Procedure Laterality Date  . ABDOMINAL EXPLORATION SURGERY    . ABLATION     2018/uterine   . APPENDECTOMY     1984 due to stones in appendix   . TUBAL LIGATION     1991    Family History  Problem Relation Age of Onset  . Cancer Mother        lung ca  . Mental illness Mother   . Clotting disorder Father        embolism  . Heart disease Father   . Hypertension Father   . Stroke Father   . Breast cancer  Sister        Breast Cancer  . Alcohol abuse Sister   . Arthritis Sister   . Asthma Sister   . Cancer Sister        ?breast   . Drug abuse Sister   . Alcohol abuse Brother   . Drug abuse Brother   . Asthma Daughter   . Cancer Maternal Aunt        lung cancer   . Cancer Maternal Grandmother        ? type   . Mental illness Maternal Grandmother   . Stroke Maternal Grandmother   . Heart disease Maternal Grandfather   . Heart disease Paternal Grandfather   . Arthritis Sister   . Cancer Sister        breast 71 doing well genetic testing neg per pt   . Cancer Maternal Aunt        bladder and colon cancer     SOCIAL HX:   Currently living in [place]   Current Outpatient Medications:  .  albuterol (PROVENTIL HFA;VENTOLIN HFA) 108 (90 Base) MCG/ACT inhaler, Inhale 1-2 puffs into the lungs every 6 (six) hours as needed for wheezing or shortness of breath., Disp: 1 Inhaler, Rfl: 12 .  ALPRAZolam (XANAX) 1 MG tablet,  Take 1 tablet (1 mg total) by mouth 2 (two) times daily as needed for anxiety., Disp: 60 tablet, Rfl: 2 .  buPROPion (WELLBUTRIN XL) 150 MG 24 hr tablet, Take 1 tablet (150 mg total) by mouth daily., Disp: 90 tablet, Rfl: 3 .  cetirizine (ZYRTEC) 10 MG tablet, Take 10 mg by mouth daily., Disp: , Rfl:  .  DYMISTA 137-50 MCG/ACT SUSP, Place 1 spray into the nose 2 (two) times daily as needed., Disp: 1 Bottle, Rfl: 12 .  ibuprofen (ADVIL,MOTRIN) 200 MG tablet, Take 200 mg by mouth every 6 (six) hours as needed., Disp: , Rfl:  .  pantoprazole (PROTONIX) 40 MG tablet, Take 1 tablet (40 mg total) by mouth daily. 30 minutes before food, Disp: 90 tablet, Rfl: 3  EXAM:  VITALS per patient if applicable:  GENERAL: alert, oriented, appears well and in no acute distress  HEENT: atraumatic, conjunttiva clear, no obvious abnormalities on inspection of external nose and ears  NECK: normal movements of the head and neck  LUNGS: on inspection no signs of respiratory distress, breathing  rate appears normal, no obvious gross SOB, gasping or wheezing  CV: no obvious cyanosis  MS: moves all visible extremities without noticeable abnormality  PSYCH/NEURO: pleasant and cooperative, no obvious depression or anxiety, speech and thought processing grossly intact  ASSESSMENT AND PLAN:  Discussed the following assessment and plan:  Upper respiratory tract infection, unspecified type -covid 19 test today results in 24 hrs in [place] rec vitamin C1000 mg qd, zinc 100 mg qd, D3 2000-4000 IU qd  Warm tea hone and lemon  mucinex dm  Call back Friday if not better will consider Abx  Anxiety and depression Cont wellbutrin and prn xanax  Consider therapy in future   HM Flu shothad 07/2019 had  Tdap had 5/29/15per [facility] notes had 2017 Td 10/15/15 left arm ? Year pnuemonia vaccinesmoker due again -reviewed PCP records pna 65 had 02/10/10 Consider shingrixage 53 y.o  Colonoscopy due age 18 y.oand consider EGD with Dr. Jonathon Bellows pt currently with [facility] insurance and in [place] will need GI in [place] or [place] [facility]1/13/17 [facility]. Pt established with Dr. Leonides Schanz will need pap due currently in [place] 10/9/19order in for future  -1 pk last 3 days to 1 week max 1 ppd in the past x 2 years smoker since age 56 y.o FH mom and m aunt lung ca -due currently in [place] Sawdermatology tbse h/o LN2 to lipappt 12/10/19will need f/u in future   Will need fating labs due currently in [place]   Tsh normal 11/12/17  Declines HIV testing GCT negative 10/15/15 hcv neg 10/15/15 RPR neg 10/15/15  hiv neg 10/15/15  Saw Dr. 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 Pt prefers to treat via telemedicine empirically rather then risking or undertaking an in person visit at this moment. Patient agrees to seek prompt in person care if worsening, new symptoms arise, or if is not improving with treatment.   I discussed the assessment and treatment plan with the patient. The patient was  provided an opportunity to ask questions and all were answered. The patient agreed with the plan and demonstrated an understanding of the instructions.   The patient was advised to call back or seek an in-person evaluation if the symptoms worsen or if the condition fails to improve as anticipated.  Time spent 20 minutes  Delorise Jackson, MD

## 2019-09-25 ENCOUNTER — Encounter: Payer: Self-pay | Admitting: Internal Medicine

## 2019-09-25 ENCOUNTER — Telehealth: Payer: Self-pay | Admitting: Internal Medicine

## 2019-09-25 NOTE — Telephone Encounter (Signed)
I called pt regarding f/u appt Return for prn. And to get some demo information.

## 2019-09-25 NOTE — Telephone Encounter (Signed)
Pt called back updated demographic information and said her COVID test was negative. She also said she is still congested and will update Dr. Olivia Mackie tomorrow on how she is doing.

## 2019-09-26 ENCOUNTER — Other Ambulatory Visit: Payer: Self-pay | Admitting: Internal Medicine

## 2019-09-26 DIAGNOSIS — J321 Chronic frontal sinusitis: Secondary | ICD-10-CM

## 2019-09-26 MED ORDER — AZITHROMYCIN 250 MG PO TABS: ORAL_TABLET | ORAL | 0 refills | Status: AC

## 2019-09-26 NOTE — Telephone Encounter (Signed)
Sent [facility] to [facility] in [place] [facility]?   Rec pt use nasal saline and flonase over the counter  Allergy pill over the counter I.e zyrtec, claritin or xyzal or allegra at night

## 2019-09-26 NOTE — Telephone Encounter (Signed)
Patient has been informed.
